# Patient Record
Sex: Male | Born: 1995 | Race: White | Hispanic: Yes | Marital: Single | Smoking: Never smoker
Health system: Southern US, Community
[De-identification: ages and names within clinical notes are randomized; demographics above are authoritative.]

## PROBLEM LIST (undated history)

## (undated) DIAGNOSIS — E079 Disorder of thyroid, unspecified: Secondary | ICD-10-CM

## (undated) DIAGNOSIS — E039 Hypothyroidism, unspecified: Secondary | ICD-10-CM

## (undated) HISTORY — PX: TONSILLECTOMY: SUR1361

---
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
## 2014-01-17 ENCOUNTER — Encounter: Payer: Self-pay | Admitting: Family Medicine

## 2014-01-17 ENCOUNTER — Ambulatory Visit (INDEPENDENT_AMBULATORY_CARE_PROVIDER_SITE_OTHER): Payer: Medicaid Other | Admitting: Family Medicine

## 2014-01-17 VITALS — BP 128/71 | HR 66 | Temp 98.3°F | Ht 71.0 in | Wt 229.2 lb

## 2014-01-17 DIAGNOSIS — M549 Dorsalgia, unspecified: Secondary | ICD-10-CM

## 2014-01-17 MED ORDER — IBUPROFEN 600 MG PO TABS: 600.0000 mg | ORAL_TABLET | Freq: Three times a day (TID) | ORAL | Status: DC | PRN

## 2014-01-17 NOTE — Patient Instructions (Signed)
I think that you have strained your back. Please do the exercises on the handout for the next 2 weeks at least. If the pain gets worse or you develop symptoms in your legs, please come back and see [place]. You can also use ice packs and ibuprofen to help reduce inflammation.

## 2014-01-21 NOTE — Assessment & Plan Note (Addendum)
Muscle strain, no palpable spasm, no red flags - ice, ibuprofen - core strengthening exercises - rtc if not improving in 2 weeks

## 2014-01-21 NOTE — Progress Notes (Signed)
   Subjective:    Patient ID: Robert Black, male    DOB: 11/14/1995, 18 y.o.   MRN: [id number]  Back Pain   Pt presents for low back pain over the past 2 weeks. He does not remember a specific injury but does report lifting weights recently. Pain is bilateral, achy, and non-radiating. Denies fever, headache, saddle anesthesia, bowel or bladder changes.   Review of Systems  Musculoskeletal: Positive for back pain.   See HPI    Objective:   Physical Exam  Nursing note and vitals reviewed. Constitutional: He is oriented to person, place, and time. He appears well-developed and well-nourished. No distress.  HENT:  Head: Normocephalic and atraumatic.  Eyes: Conjunctivae are normal. Right eye exhibits no discharge. Left eye exhibits no discharge. No scleral icterus.  Neck: Normal range of motion. Neck supple.  Cardiovascular: Normal rate.   Pulmonary/Chest: Effort normal.  Musculoskeletal:       Lumbar back: He exhibits tenderness and pain. He exhibits normal range of motion, no bony tenderness, no swelling, no edema, no deformity, no laceration, no spasm and normal pulse.  Normal strength and sensation in bilateral lower extremities  Neurological: He is alert and oriented to person, place, and time.  Skin: Skin is warm and dry. No rash noted. He is not diaphoretic.  Psychiatric: He has a normal mood and affect. His behavior is normal.          Assessment & Plan:

## 2014-03-04 ENCOUNTER — Ambulatory Visit (INDEPENDENT_AMBULATORY_CARE_PROVIDER_SITE_OTHER): Payer: Medicaid Other | Admitting: Family Medicine

## 2014-03-04 ENCOUNTER — Other Ambulatory Visit (HOSPITAL_COMMUNITY)
Admission: RE | Admit: 2014-03-04 | Discharge: 2014-03-04 | Disposition: A | Discharge status: Home / Self Care | Payer: Medicaid Other | Source: Ambulatory Visit | Attending: Family Medicine | Admitting: Family Medicine

## 2014-03-04 ENCOUNTER — Encounter: Payer: Self-pay | Admitting: Family Medicine

## 2014-03-04 VITALS — BP 134/73 | HR 84 | Temp 98.2°F | Resp 18 | Wt 224.0 lb

## 2014-03-04 DIAGNOSIS — Z113 Encounter for screening for infections with a predominantly sexual mode of transmission: Secondary | ICD-10-CM | POA: Diagnosis not present

## 2014-03-04 DIAGNOSIS — Z7251 High risk heterosexual behavior: Secondary | ICD-10-CM

## 2014-03-04 DIAGNOSIS — R300 Dysuria: Secondary | ICD-10-CM

## 2014-03-04 LAB — POCT URINALYSIS DIPSTICK
Bilirubin, UA: NEGATIVE
GLUCOSE UA: NEGATIVE
Ketones, UA: NEGATIVE
LEUKOCYTES UA: NEGATIVE
NITRITE UA: NEGATIVE
Protein, UA: NEGATIVE
RBC UA: NEGATIVE
Spec Grav, UA: 1.025
UROBILINOGEN UA: 0.2
pH, UA: 6

## 2014-03-04 MED ORDER — AZITHROMYCIN 500 MG PO TABS
500.0000 mg | ORAL_TABLET | Freq: Once | ORAL | Status: AC
  Administered 2014-03-04: 500 mg via ORAL

## 2014-03-04 MED ORDER — CEFTRIAXONE SODIUM 250 MG IJ SOLR: 250.0000 mg | Freq: Once | INTRAMUSCULAR | Status: DC

## 2014-03-04 MED ORDER — CEFTRIAXONE SODIUM 250 MG IJ SOLR
1.0000 g | Freq: Once | INTRAMUSCULAR | Status: AC
  Administered 2014-03-04: 1 g via INTRAMUSCULAR

## 2014-03-04 NOTE — Assessment & Plan Note (Signed)
History of gonorrhea.  Unprotectived sex with partner, unsure of STD status. Denies any oral sex taking place. Dysuria present. No signs on exam.  - UA showing no signs of infection.  - urine cytology: chlamydia, gonorrhea, trich pending  - HIV, RPR  - will treat based on history of previous STD exposure and symptoms   - CTX 250 mg x 1   - Azithromycin 1 g x 1

## 2014-03-04 NOTE — Patient Instructions (Signed)
Thank you for coming in,   We will treat you based no your symptoms today. Please let me know if your symptoms persist after the treatment with antibiotics.   I will call you with the results of your lab tests today.    Please feel free to call with any questions or concerns at any time, at [phone]. --Dr. Jordan LikesSchmitz  Dysuria Dysuria is the medical term for pain with urination. There are many causes for dysuria, but urinary tract infection is the most common. If a urinalysis was performed it can show that there is a urinary tract infection. A urine culture confirms that you or your child is sick. You will need to follow up with a healthcare provider because:  If a urine culture was done you will need to know the culture results and treatment recommendations.  If the urine culture was positive, you or your child will need to be put on antibiotics or know if the antibiotics prescribed are the right antibiotics for your urinary tract infection.  If the urine culture is negative (no urinary tract infection), then other causes may need to be explored or antibiotics need to be stopped. Today laboratory work may have been done and there does not seem to be an infection. If cultures were done they will take at least 24 to 48 hours to be completed. Today x-rays may have been taken and they read as normal. No cause can be found for the problems. The x-rays may be re-read by a radiologist and you will be contacted if additional findings are made. You or your child may have been put on medications to help with this problem until you can see your primary caregiver. If the problems get better, see your primary caregiver if the problems return. If you were given antibiotics (medications which kill germs), take all of the mediations as directed for the full course of treatment.  If laboratory work was done, you need to find the results. Leave a telephone number where you can be reached. If this is not possible,  make sure you find out how you are to get test results. HOME CARE INSTRUCTIONS   Drink lots of fluids. For adults, drink eight, 8 ounce glasses of clear juice or water a day. For children, replace fluids as suggested by your caregiver.  Empty the bladder often. Avoid holding urine for long periods of time.  After a bowel movement, women should cleanse front to back, using each tissue only once.  Empty your bladder before and after sexual intercourse.  Take all the medicine given to you until it is gone. You may feel better in a few days, but TAKE ALL MEDICINE.  Avoid caffeine, tea, alcohol and carbonated beverages, because they tend to irritate the bladder.  In men, alcohol may irritate the prostate.  Only take over-the-counter or prescription medicines for pain, discomfort, or fever as directed by your caregiver.  If your caregiver has given you a follow-up appointment, it is very important to keep that appointment. Not keeping the appointment could result in a chronic or permanent injury, pain, and disability. If there is any problem keeping the appointment, you must call back to this facility for assistance. SEEK IMMEDIATE MEDICAL CARE IF:   Back pain develops.  A fever develops.  There is nausea (feeling sick to your stomach) or vomiting (throwing up).  Problems are no better with medications or are getting worse. MAKE SURE YOU:   Understand these instructions.  Will watch your  condition.  Will get help right away if you are not doing well or get worse. Document Released: 01/29/2004 Document Revised: 07/25/2011 Document Reviewed: 12/06/2007 [facility] Patient Information 2015 [employer] [place]. This information is not intended to replace advice given to you by your health care provider. Make sure you discuss any questions you have with your health care provider.

## 2014-03-04 NOTE — Progress Notes (Signed)
   Subjective:    Patient ID: Robert SeatsChristian Black, male    DOB: 07/13/1995, 18 y.o.   MRN: [id number]  HPI  Robert Black is here for in SDA for dysuria.   Symptoms have been ongoing for 2-3 weeks. They have been unchanged. Denies any fever, chillls or night sweats. He reports unprotective sex with someone unsure of their STD exposure. He has a history of high risk sexual behavior with a history of gonorrhea. Denies any discharge, erythema or warmth on or around his genital. He denies any abdominal pain or back pain. He's been having normal bowel movements.    Current Outpatient Prescriptions on File Prior to Visit  Medication Sig Dispense Refill  . ibuprofen (ADVIL,MOTRIN) 600 MG tablet Take 1 tablet (600 mg total) by mouth every 8 (eight) hours as needed for moderate pain.  30 tablet  1  . levothyroxine (SYNTHROID, LEVOTHROID) 25 MCG tablet Take 1 tablet (25 mcg total) by mouth daily.  90 tablet  3   No current facility-administered medications on file prior to visit.    Review of Systems See HPI     Objective:   Physical Exam BP 134/73  Pulse 84  Temp(Src) 98.2 F (36.8 C) (Oral)  Resp 18  Wt 224 lb (101.606 kg)  SpO2 100% General: NAD, well appearing, alert  GU: normal external male genitalia, milking of penis revealed no discharge, no inguinal LAD, no erythema or warmth, no TTP on scrotum or penis         Assessment & Plan:

## 2014-03-05 LAB — URINE CYTOLOGY ANCILLARY ONLY
Chlamydia: POSITIVE — AB
NEISSERIA GONORRHEA: NEGATIVE
Trichomonas: NEGATIVE

## 2014-03-06 ENCOUNTER — Telehealth: Payer: Self-pay | Admitting: *Deleted

## 2014-03-06 NOTE — Telephone Encounter (Signed)
Spoke with patient and gave below message 

## 2014-03-06 NOTE — Telephone Encounter (Signed)
Message copied by Farrell OursEVANS, Hy Swiatek K on Thu Mar 06, 2014  1:44 PM ------      Message from: Clare GandySCHMITZ, JEREMY E      Created: Thu Mar 06, 2014 10:10 AM       Please call patient and inform that he is positive for chlamydia. He was treated for this during his visit. Please tell his to inform his partner so they can be treated as well. Thank you. ------

## 2014-03-06 NOTE — Telephone Encounter (Signed)
Message copied by Farrell OursEVANS, Nakeisha Greenhouse K on Thu Mar 06, 2014  1:45 PM ------      Message from: Clare GandySCHMITZ, JEREMY E      Created: Thu Mar 06, 2014 10:10 AM       Please call patient and inform that he is positive for chlamydia. He was treated for this during his visit. Please tell his to inform his partner so they can be treated as well. Thank you. ------

## 2014-03-07 ENCOUNTER — Other Ambulatory Visit: Payer: Medicaid Other

## 2014-03-07 DIAGNOSIS — Z7251 High risk heterosexual behavior: Secondary | ICD-10-CM

## 2014-03-07 DIAGNOSIS — R300 Dysuria: Secondary | ICD-10-CM

## 2014-03-07 NOTE — Progress Notes (Signed)
HIV AND RPR DONE TODAY Berna Gitto 

## 2014-03-08 LAB — RPR

## 2014-03-08 LAB — HIV ANTIBODY (ROUTINE TESTING W REFLEX): HIV 1&2 Ab, 4th Generation: NONREACTIVE

## 2014-03-10 ENCOUNTER — Telehealth: Payer: Self-pay | Admitting: *Deleted

## 2014-03-10 NOTE — Telephone Encounter (Signed)
Spoke with patient and informed him of below results 

## 2014-03-10 NOTE — Telephone Encounter (Signed)
Message copied by Farrell OursEVANS, Miachel Nardelli K on Mon Mar 10, 2014 12:17 PM ------      Message from: Myra RudeSCHMITZ, JEREMY E      Created: Sat Mar 08, 2014  5:41 PM       Please call patient and inform that his HIV and RPR were non reactive. Thank you. ------

## 2014-06-24 ENCOUNTER — Encounter: Payer: Self-pay | Admitting: Family Medicine

## 2014-06-24 ENCOUNTER — Ambulatory Visit (INDEPENDENT_AMBULATORY_CARE_PROVIDER_SITE_OTHER): Payer: Medicaid Other | Admitting: Family Medicine

## 2014-06-24 VITALS — BP 136/78 | HR 78 | Temp 98.1°F | Ht 71.0 in | Wt 231.9 lb

## 2014-06-24 DIAGNOSIS — T148 Other injury of unspecified body region: Secondary | ICD-10-CM

## 2014-06-24 DIAGNOSIS — W57XXXA Bitten or stung by nonvenomous insect and other nonvenomous arthropods, initial encounter: Secondary | ICD-10-CM

## 2014-06-24 DIAGNOSIS — L03116 Cellulitis of left lower limb: Secondary | ICD-10-CM

## 2014-06-24 MED ORDER — CEPHALEXIN 500 MG PO CAPS: 500.0000 mg | ORAL_CAPSULE | Freq: Three times a day (TID) | ORAL | Status: DC

## 2014-06-24 NOTE — Patient Instructions (Addendum)
Thank you for coming in, today!  I think you're right that these areas are insect bites. They may be a mild infection in the leg bite, especially, but it does not look serious. I want you to take an antibiotic called Keflex (cephalexin), 500 mg three times per day for 5 days. This will help clear up any infection.  I don't think you caught any other viral infections from the bites. If you want, you can take something over the counter like Benadryl, Zyrtec, or Claritin to help with any itching. Keep the areas clean with regular soap and water. DO NOT scratch if at all possible.  If you develop fevers (over 100.4), start throwing up or are unable to keep food down, or if you have worse redness, swelling, or pain around the bites, call or come back to see me. Otherwise come back to see me as you need.  Please feel free to call with any questions or concerns at any time, at [phone]. --Dr. Casper HarrisonStreet

## 2014-06-24 NOTE — Progress Notes (Signed)
   Subjective:    Patient ID: Robert Black, male    DOB: 11/25/1995, 19 y.o.   MRN: [id number]  HPI: Pt presents to clinic, accompanied by mother, with complaint of insect bite in [place] about a week ago (he believe 2/4); interview conducted with mother present per pt permission. He was bitten on the right dorsal wrist and left posterior ankle. He believes the wrist bite was a spider (thinks he saw two "spots" in the middle of the bite) and is unsure what bit him on the ankle. The areas have been itching and slightly red around them, but they are not bleeding. The ankle did have some drainage for a day or so but is not draining now. He has not had fevers, chills, diffuse rash, N/V, change in appetite, or change in bowel / bladder habits. He also denies body aches or coryza-type symptoms; he has had one friend with some flu-like symptoms but has not had any himself. He has not taken any medications other then his long-term Synthroid.  Review of Systems: As above.     Objective:   Physical Exam BP 136/78 mmHg  Pulse 78  Temp(Src) 98.1 F (36.7 C) (Oral)  Ht 5\' 11"  (1.803 m)  Wt 231 lb 14.4 oz (105.189 kg)  BMI 32.36 kg/m2 Gen: well-appearing young adult male HEENT: [place]/AT, EOMI, PERRLA, TM's clear bilaterally  Nasal and posterior oropharyngeal mucosae clear  No tonsillar enlargement Cardio: RRR, no murmur appreciated Pulm: CTAB, no wheezes Abd: soft, nontender, BS+ Ext: right dorsal wrist with red, raised, ~0.5 cm lesion without surrounding induration and minimal erythema  Left posterior lower leg just above ankle with similar lesion, ~0.5 cm in diameter  Leg lesion with slightly more surrounding erythema (~0.5 cm past lesion itself) and mild induration (~2-3 mm)  Both lesions minimally tender with central eschars but no bleeding or drainage     Assessment & Plan:  19yo male with likely insect / possibly spider bites to right wrist and left leg just above ankle - both lesions with  some features suggesting early mild cellulitis, leg lesion moreso - no systemic symptoms to suggest frank sepsis or bug-borne viral disease  Plan: - Rx for Keflex 500 mg TID for 5 days to cover for possible early cellulitis - recommended good local hygiene to both areas, OTC antihistamine for itching, etc, for symptoms management - reviewed red flags that would suggest spreading local or systemic infections and gave specific instructions on when to f/u in clinic or present to the ED - f/u PRN otherwise  Robert Mortonhristopher M Tanelle Lanzo, MD PGY-3, [facility] 06/24/2014, 7:01 PM

## 2014-07-04 ENCOUNTER — Ambulatory Visit (INDEPENDENT_AMBULATORY_CARE_PROVIDER_SITE_OTHER): Payer: Medicaid Other | Admitting: Family Medicine

## 2014-07-04 ENCOUNTER — Encounter: Payer: Self-pay | Admitting: Family Medicine

## 2014-07-04 VITALS — BP 111/73 | HR 73 | Temp 97.8°F | Ht 71.0 in | Wt 234.8 lb

## 2014-07-04 DIAGNOSIS — M546 Pain in thoracic spine: Secondary | ICD-10-CM

## 2014-07-04 DIAGNOSIS — M549 Dorsalgia, unspecified: Secondary | ICD-10-CM

## 2014-07-04 NOTE — Patient Instructions (Signed)
It was a pleasure to see you in the office today. I believe your back pain is muscular in nature.   Ibuprofen 600mg  by mouth every 8 hours with something to eat, for the next 3 to 5 days for the pain.   Heating pad two to three times a day, as well as regular stretching.   Please call immediately if a rash develops over the weekend.  If the pain is not improving by the beginning of next week, please call back to the office.

## 2014-07-04 NOTE — Progress Notes (Signed)
   Subjective:    Patient ID: Robert SeatsChristian Walthall, male    DOB: 09/15/1995, 19 y.o.   MRN: [id number]  HPI Patient here for SDA for left-sided thoracic back pain, started when he woke up yesterday morning and has continued since. May be slightly better this morning. Yesterday morning he took a tablet of ibuprofen 600mg  (9am), however did not feel that it helped him.  Pain has continued, may be radiating slightly to under L axilla.  Has not noticed skin changes.  No fevers or chills, no cough or chest pain. Did go to the gym 2 days ago (running, no weights or lifting).  No falls or trauma.  No manual labor. He is right-hand dominant.     Review of Systems     Objective:   Physical Exam Generally well appearing, no apparent distress HEENT Neck supple. No cervical adenopathy. Able to rotate neck freely, without limitation (active).  Reports some pain in left trapezius and left thoracic paraspinous mm with neck flexion (chin-to-chest).  Full active ROM with both shoulders.  Neer and Hawkins negative. Hand grip full.   Palpable tenderness along left thoracic paraspinous muscles below scapula.  No skin changes over back, abdomen, flank.  Axillary palpation without adenopathy or tenderness.  COR regular S1S2 PULM Clear bilaterally, good air movement noted, no wheezes or rales       Assessment & Plan:

## 2014-07-30 ENCOUNTER — Encounter: Payer: Self-pay | Admitting: Family Medicine

## 2014-07-30 ENCOUNTER — Ambulatory Visit (INDEPENDENT_AMBULATORY_CARE_PROVIDER_SITE_OTHER): Payer: Medicaid Other | Admitting: Family Medicine

## 2014-07-30 VITALS — BP 116/75 | HR 84 | Temp 98.4°F | Ht 71.0 in | Wt 234.6 lb

## 2014-07-30 DIAGNOSIS — A630 Anogenital (venereal) warts: Secondary | ICD-10-CM

## 2014-07-30 MED ORDER — PODOFILOX 0.5 % EX SOLN: CUTANEOUS | Status: DC

## 2014-07-30 NOTE — Patient Instructions (Signed)
  Place dermatology genital warts patient instructions here.

## 2014-07-30 NOTE — Progress Notes (Signed)
  Subjective:    Robert Black is a 19 y.o. male who complains of genital rash . The areas are located on suprapubic/pubic region. They have been present for 1 month. The patient denies pain or cellulitic infection symptoms.  Does endorse unprotected sexual intercourse about 2-3 months ago.  Recent trip to [place] about one month ago, however, area was there at this point.  Denies penile d/c, fever, chills, arthralgias, dysuria, recent changes in soaps, detergents.   The following portions of the patient's history were reviewed and updated as appropriate: allergies, current medications, past family history, past medical history, past social history, past surgical history and problem list.  Review of Systems Pertinent items are noted in HPI.    Objective:    Skin: 4-5 pink,  smooth flattened papules/verroucous lesions on dorsal penile root/genital region. Size range is 0.5 cm.    Assessment:    Warts (Verruca Vulgaris/condyloma acuminata)    Plan:    1. The viral etiology and natural history has been discussed.  2. Various treatment methods, side effects and failure rates have been discussed.   3. Podophyllotoxin BID x 3 days, rest for 4 days, then reapply x 4 if needed  4. The patient will return at 2-4 f/u at that time

## 2014-07-31 NOTE — Progress Notes (Signed)
I was preceptor the day of this visit.   

## 2015-01-23 ENCOUNTER — Ambulatory Visit (INDEPENDENT_AMBULATORY_CARE_PROVIDER_SITE_OTHER): Payer: Medicaid Other | Admitting: Endocrinology

## 2015-01-23 ENCOUNTER — Encounter: Payer: Self-pay | Admitting: Endocrinology

## 2015-01-23 VITALS — BP 124/87 | HR 80 | Temp 97.6°F | Ht 71.0 in | Wt 219.0 lb

## 2015-01-23 DIAGNOSIS — E039 Hypothyroidism, unspecified: Secondary | ICD-10-CM | POA: Diagnosis not present

## 2015-01-23 MED ORDER — LEVOTHYROXINE SODIUM 25 MCG PO TABS: 25.0000 ug | ORAL_TABLET | Freq: Every day | ORAL | Status: DC

## 2015-01-23 NOTE — Patient Instructions (Addendum)
Please make a big push to take the thyroid pill every day.  If you miss a day, take 2 the next day.  i have sent a prescription to your pharmacy, to refill.   Please do the blood test in 1 month.  We'll contact you with results.   With time, your need fort he medication will probably increase to more than 100 mcg/day. Please return in 1 year.        Hypothyroidism The thyroid is a large gland located in the lower front of your neck. The thyroid gland helps control metabolism. Metabolism is how your body handles food. It controls metabolism with the hormone thyroxine. When this gland is underactive (hypothyroid), it produces too little hormone.  CAUSES These include:   Absence or destruction of thyroid tissue.  Goiter due to iodine deficiency.  Goiter due to medications.  Congenital defects (since birth).  Problems with the pituitary. This causes a lack of TSH (thyroid stimulating hormone). This hormone tells the thyroid to turn out more hormone. SYMPTOMS  Lethargy (feeling as though you have no energy)  Cold intolerance  Weight gain (in spite of normal food intake)  Dry skin  Coarse hair  Menstrual irregularity (if severe, may lead to infertility)  Slowing of thought processes Cardiac problems are also caused by insufficient amounts of thyroid hormone. Hypothyroidism in the newborn is cretinism, and is an extreme form. It is important that this form be treated adequately and immediately or it will lead rapidly to retarded physical and mental development. DIAGNOSIS  To prove hypothyroidism, your caregiver may do blood tests and ultrasound tests. Sometimes the signs are hidden. It may be necessary for your caregiver to watch this illness with blood tests either before or after diagnosis and treatment. TREATMENT  Low levels of thyroid hormone are increased by using synthetic thyroid hormone. This is a safe, effective treatment. It usually takes about four weeks to gain the full  effects of the medication. After you have the full effect of the medication, it will generally take another four weeks for problems to leave. Your caregiver may start you on low doses. If you have had heart problems the dose may be gradually increased. It is generally not an emergency to get rapidly to normal. HOME CARE INSTRUCTIONS   Take your medications as your caregiver suggests. Let your caregiver know of any medications you are taking or start taking. Your caregiver will help you with dosage schedules.  As your condition improves, your dosage needs may increase. It will be necessary to have continuing blood tests as suggested by your caregiver.  Report all suspected medication side effects to your caregiver. SEEK MEDICAL CARE IF: Seek medical care if you develop:  Sweating.  Tremulousness (tremors).  Anxiety.  Rapid weight loss.  Heat intolerance.  Emotional swings.  Diarrhea.  Weakness. SEEK IMMEDIATE MEDICAL CARE IF:  You develop chest pain, an irregular heart beat (palpitations), or a rapid heart beat. MAKE SURE YOU:   Understand these instructions.  Will watch your condition.  Will get help right away if you are not doing well or get worse. Document Released: 05/02/2005 Document Revised: 07/25/2011 Document Reviewed: 12/21/2007 [facility] Patient Information 2015 [place], [place]. This information is not intended to replace advice given to you by your health care provider. Make sure you discuss any questions you have with your health care provider.

## 2015-01-23 NOTE — Progress Notes (Signed)
Subjective:    Patient ID: Robert Black, male    DOB: 1995/12/18, 19 y.o.   MRN: [id number]  HPI Pt returns for f/u of chronic primary hypothyroidism (dx'ed 2014; he has been on thyroid hormone therapy since dx; he has never taken non-prescribed thyroid hormone therapy; he has never had thyroid imaging, surgery, or XRT to the neck; he has never been on amiodarone or lithium).  He misses the synthroid approx once-twice per week.  pt states he feels well in general.   Past Medical History  Diagnosis Date  . Asthma     Past Surgical History  Procedure Laterality Date  . Tonsillectomy      Social History   Social History  . Marital Status: Single    Spouse Name: N/A  . Number of Children: N/A  . Years of Education: N/A   Occupational History  . Not on file.   Social History Main Topics  . Smoking status: Never Smoker   . Smokeless tobacco: Never Used  . Alcohol Use: No  . Drug Use: No  . Sexual Activity: Yes   Other Topics Concern  . 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 ibuprofen (ADVIL,MOTRIN) 600 MG tablet Take 1 tablet (600 mg total) by mouth every 8 (eight) hours as needed for moderate pain. 30 tablet 1   No current facility-administered medications on file prior to visit.    No Known Allergies  Family History  Problem Relation Age of Onset  . Thyroid disease Mother     hypothyroid  . Obesity Mother   . Thyroid disease Maternal Grandmother   . Hypertension Maternal Grandmother     BP 124/87 mmHg  Pulse 80  Temp(Src) 97.6 F (36.4 C) (Oral)  Ht  (1.803 m)  Wt 219 lb (99.338 kg)  BMI 30.56 kg/m2  SpO2 96%    Review of Systems He has lost a few lbs.    Objective:   Physical Exam VITAL SIGNS:  See vs page GENERAL: no distress NECK: There is no  palpable thyroid enlargement.  No thyroid nodule is palpable.  No palpable lymphadenopathy at the anterior neck.         Assessment & Plan:  Hypothyroidism, due for recheck medication noncompliance, worse.    Patient is advised the following: Patient Instructions  Please make a big push to take the thyroid pill every day.  If you miss a day, take 2 the next day.  i have sent a prescription to your pharmacy, to refill.   Please do the blood test in 1 month.  We'll contact you with results.   With time, your need fort he medication will probably increase to more than 100 mcg/day. Please return in 1 year.        Hypothyroidism The thyroid is a large gland located in the lower front of your neck. The thyroid gland helps control metabolism. Metabolism is how your body handles food. It controls metabolism with the hormone thyroxine. When this gland is underactive (hypothyroid), it produces too little hormone.  CAUSES These include:   Absence or destruction of thyroid tissue.  Goiter due to iodine deficiency.  Goiter due to medications.  Congenital defects (since birth).  Problems with the pituitary. This causes a lack of TSH (thyroid  stimulating hormone). This hormone tells the thyroid to turn out more hormone. SYMPTOMS  Lethargy (feeling as though you have no energy)  Cold intolerance  Weight gain (in spite of normal food intake)  Dry skin  Coarse hair  Menstrual irregularity (if severe, may lead to infertility)  Slowing of thought processes Cardiac problems are also caused by insufficient amounts of thyroid hormone. Hypothyroidism in the newborn is cretinism, and is an extreme form. It is important that this form be treated adequately and immediately or it will lead rapidly to retarded physical and mental development. DIAGNOSIS  To prove hypothyroidism, your caregiver may do blood tests and ultrasound tests. Sometimes the signs are hidden. It may be necessary for your  caregiver to watch this illness with blood tests either before or after diagnosis and treatment. TREATMENT  Low levels of thyroid hormone are increased by using synthetic thyroid hormone. This is a safe, effective treatment. It usually takes about four weeks to gain the full effects of the medication. After you have the full effect of the medication, it will generally take another four weeks for problems to leave. Your caregiver may start you on low doses. If you have had heart problems the dose may be gradually increased. It is generally not an emergency to get rapidly to normal. HOME CARE INSTRUCTIONS   Take your medications as your caregiver suggests. Let your caregiver know of any medications you are taking or start taking. Your caregiver will help you with dosage schedules.  As your condition improves, your dosage needs may increase. It will be necessary to have continuing blood tests as suggested by your caregiver.  Report all suspected medication side effects to your caregiver. SEEK MEDICAL CARE IF: Seek medical care if you develop:  Sweating.  Tremulousness (tremors).  Anxiety.  Rapid weight loss.  Heat intolerance.  Emotional swings.  Diarrhea.  Weakness. SEEK IMMEDIATE MEDICAL CARE IF:  You develop chest pain, an irregular heart beat (palpitations), or a rapid heart beat. MAKE SURE YOU:   Understand these instructions.  Will watch your condition.  Will get help right away if you are not doing well or get worse. Document Released: 05/02/2005 Document Revised: 07/25/2011 Document Reviewed: 12/21/2007 [facility] Patient Information 2015 [place], [place]. This information is not intended to replace advice given to you by your health care provider. Make sure you discuss any questions you have with your health care provider.

## 2015-02-23 ENCOUNTER — Other Ambulatory Visit (INDEPENDENT_AMBULATORY_CARE_PROVIDER_SITE_OTHER): Payer: Medicaid Other

## 2015-02-23 DIAGNOSIS — E039 Hypothyroidism, unspecified: Secondary | ICD-10-CM

## 2015-02-23 LAB — TSH: TSH: 3.45 u[IU]/mL (ref 0.35–5.50)

## 2015-02-25 ENCOUNTER — Ambulatory Visit (INDEPENDENT_AMBULATORY_CARE_PROVIDER_SITE_OTHER): Payer: Medicaid Other | Admitting: Family Medicine

## 2015-02-25 ENCOUNTER — Encounter: Payer: Self-pay | Admitting: Family Medicine

## 2015-02-25 VITALS — BP 120/74 | HR 76 | Temp 98.0°F | Ht 71.0 in | Wt 220.0 lb

## 2015-02-25 DIAGNOSIS — F1990 Other psychoactive substance use, unspecified, uncomplicated: Secondary | ICD-10-CM

## 2015-02-25 DIAGNOSIS — E039 Hypothyroidism, unspecified: Secondary | ICD-10-CM

## 2015-02-25 NOTE — Progress Notes (Signed)
Patient ID: Robert Black, male   DOB: 09/11/1995, 19 y.o.   MRN: [id number]    Subjective: WU:[id number]:concerns for elevated BP HPI: Patient is a 19 y.o. male with a past medical history of h/o hypothyroidism presenting to clinic today for concerns of elevated BPs.  The patient states he went to [place] urgent care in [place] approximately 3 [place]. The patient states he went to the urgent care because he was acting funny after drinking alcohol and doing drugs. He thought he was snorting cocaine however the providers at the urgent care told him it was crystal meth.  He cannot recall what his blood pressure was at that time. He did note a headache.  He was prescribed HCTZ (unknown dosage) and losartan 100mg . He stopped taking these 3-4 days ago.     The patient also has a h/o hypothyroidism. No heat/cold intolerance, no change in skin/hair, or palpitations. He has been on synthroid for over a year. He saw his endocrinologist last month and got a repeat TSH that was normal  Social History: Has intermittently smoked cigarettes in the past  ROS: All other systems reviewed and are negative.  Past Medical History Patient Active Problem List   Diagnosis Date Noted  . Illicit drug use 02/25/2015  . Condyloma acuminata 07/30/2014  . Acute thoracic back pain 07/04/2014  . High risk sexual behavior 03/04/2014  . Back pain without radiculopathy 01/17/2014  . Obesity (BMI >32) 11/26/2013  . Hypothyroidism 06/03/2013  . Abnormal endocrine laboratory test finding 11/28/2012  . Acanthosis 11/28/2012  . Obesity, unspecified 11/28/2012    Medications- reviewed and updated Current Outpatient Prescriptions  Medication Sig Dispense Refill  . ibuprofen (ADVIL,MOTRIN) 600 MG tablet Take 1 tablet (600 mg total) by mouth every 8 (eight) hours as needed for moderate pain. 30 tablet 1  . levothyroxine (SYNTHROID, LEVOTHROID) 25 MCG tablet Take 1 tablet (25 mcg total) by mouth daily. 90 tablet 0   No current  facility-administered medications for this visit.    Objective: Office vital signs reviewed. BP 120/74 mmHg  Pulse 76  Temp(Src) 98 F (36.7 C) (Oral)  Ht 5\' 11"  (1.803 m)  Wt 220 lb (99.791 kg)  BMI 30.70 kg/m2   Physical Examination:  General: Awake, alert, well- nourished, NAD Neck: No thyromegaly Cardio: RRR, no m/r/g noted.  Pulm: No increased WOB.  CTAB, without wheezes, rhonchi or crackles noted.  Skin: dry, intact, no rashes or lesions  Assessment/Plan: Illicit drug use Patient with an unknown elevated BP in the setting of alcohol and crystal meth intoxication per report. His BPs have always been within the normal range in our system. His father has a h/o HTN and the patient is obese with a BMI >30. The patient stopped losartan and HCTZ approximately 3-4 days. These can have a delayed effect up to 5 days.  - follow up in 1 month after not taking anti-hypertensives to evaluate BP - Stressed importance/consequences of illicit drug use, patient voiced understanding  Hypothyroidism Per endo note, pt was not regularly taking medication. Urged pt to take Synthroid regularly and his most recent TSH after taking it regularly was within normal ranges.     No orders of the defined types were placed in this encounter.    No orders of the defined types were placed in this encounter.    Robert Black PGY-2, [facility]

## 2015-02-25 NOTE — Assessment & Plan Note (Signed)
Patient with an unknown elevated BP in the setting of alcohol and crystal meth intoxication per report. His BPs have always been within the normal range in our system. His father has a h/o HTN and the patient is obese with a BMI >30. The patient stopped losartan and HCTZ approximately 3-4 days. These can have a delayed effect up to 5 days.  - follow up in 1 month after not taking anti-hypertensives to evaluate BP - Stressed importance/consequences of illicit drug use, patient voiced understanding

## 2015-02-25 NOTE — Patient Instructions (Addendum)
STOP taking losartan and hydrochlorothiazide Follow up for a nurses visit in 1 month so we can see how you blood pressure is doing.

## 2015-02-26 ENCOUNTER — Encounter: Payer: Self-pay | Admitting: Family Medicine

## 2015-02-26 NOTE — Assessment & Plan Note (Signed)
Per endo note, pt was not regularly taking medication. Urged pt to take Synthroid regularly and his most recent TSH after taking it regularly was within normal ranges.

## 2015-03-02 ENCOUNTER — Telehealth: Payer: Self-pay

## 2015-03-02 NOTE — Telephone Encounter (Signed)
Pt is aware of results from last week TSH test

## 2015-03-25 ENCOUNTER — Ambulatory Visit (INDEPENDENT_AMBULATORY_CARE_PROVIDER_SITE_OTHER): Payer: Medicaid Other | Admitting: *Deleted

## 2015-03-25 VITALS — BP 126/82 | HR 75

## 2015-03-25 DIAGNOSIS — Z0130 Encounter for examination of blood pressure without abnormal findings: Secondary | ICD-10-CM

## 2015-03-25 DIAGNOSIS — Z136 Encounter for screening for cardiovascular disorders: Secondary | ICD-10-CM

## 2015-03-25 NOTE — Progress Notes (Signed)
   Patient in nurse clinic for blood pressure check.  Blood pressure 126/82 left arm manually, heart rate 75.  Patient denies any pain today.  Will forward to PCP.  Clovis PuMartin, Tamika L, RN

## 2015-04-07 ENCOUNTER — Encounter: Payer: Self-pay | Admitting: Family Medicine

## 2015-04-07 ENCOUNTER — Ambulatory Visit (INDEPENDENT_AMBULATORY_CARE_PROVIDER_SITE_OTHER): Payer: Medicaid Other | Admitting: Family Medicine

## 2015-04-07 VITALS — BP 120/78 | HR 88 | Temp 97.5°F | Wt 225.0 lb

## 2015-04-07 DIAGNOSIS — J020 Streptococcal pharyngitis: Secondary | ICD-10-CM | POA: Diagnosis present

## 2015-04-07 DIAGNOSIS — J029 Acute pharyngitis, unspecified: Secondary | ICD-10-CM | POA: Insufficient documentation

## 2015-04-07 LAB — POCT RAPID STREP A (OFFICE): Rapid Strep A Screen: NEGATIVE

## 2015-04-07 LAB — POCT MONO (EPSTEIN BARR VIRUS): Mono, POC: NEGATIVE

## 2015-04-07 NOTE — Assessment & Plan Note (Signed)
Most likely viral in nature.  Some concern for STI with past medical history and possible exposure. Has taken amoxicillin and developed no rash so less likely for mononucleosis - Rapid strep, Monospot, and gonorrhea and chlamydia throat culture - Informed that treatment will be dependent upon results. If all results negative the most likely viral in nature and supportive care.

## 2015-04-07 NOTE — Progress Notes (Signed)
   Subjective:    Patient ID: Robert SeatsChristian Wasser, male    DOB: 04/30/1996, 19 y.o.   MRN: [id number]  Seen for Same day visit for   CC: SORE THROAT  Sore throat began 14 days ago. He was sore throat and took amoxicillin and had improvement. He took it for three days.  Pain is: most in the morning  Severity: no pain now  Medications tried: amoxicillin  Strep throat exposure: unsure  STD exposure: has performed oral sex. Sexually active with females.   Symptoms Fever: no Cough: no Runny nose: yes Muscle aches: no Swollen Glands: no Trouble breathing: no Drooling: no Weight loss: no   Review of Systems   See HPI for ROS. Objective:  BP 120/78 mmHg  Pulse 88  Temp(Src) 97.5 F (36.4 C) (Oral)  Wt 225 lb (102.059 kg)  General: NAD, well-appearing HEENT: Moist mucous membranes, tympanic membrane's clear and intact bilaterally, clear conjunctiva, no tonsillar exudates, mild cervical adenopathy, Cardiac: RRR, normal heart sounds, no murmurs.  Respiratory: CTAB, normal effort Abdomen: soft, nontender, nondistended, no hepatic or splenomegaly. Bowel sounds present Extremities: WWP. Skin: warm and dry, no rashes noted Neuro: alert and oriented, no focal deficits     Assessment & Plan:   Sore throat Most likely viral in nature.  Some concern for STI with past medical history and possible exposure. Has taken amoxicillin and developed no rash so less likely for mononucleosis - Rapid strep, Monospot, and gonorrhea and chlamydia throat culture - Informed that treatment will be dependent upon results. If all results negative the most likely viral in nature and supportive care.

## 2015-04-07 NOTE — Patient Instructions (Signed)
Thank you for coming in,   I will call you with results from today. Based on the results will depend on the treatment plan.  Sign up for My Chart to have easy access to your labs results, and communication with your Primary care physician   Please feel free to call with any questions or concerns at any time, at [phone]. --Dr. Jordan LikesSchmitz Pharyngitis Pharyngitis is redness, pain, and swelling (inflammation) of your pharynx.  CAUSES  Pharyngitis is usually caused by infection. Most of the time, these infections are from viruses (viral) and are part of a cold. However, sometimes pharyngitis is caused by bacteria (bacterial). Pharyngitis can also be caused by allergies. Viral pharyngitis may be spread from person to person by coughing, sneezing, and personal items or utensils (cups, forks, spoons, toothbrushes). Bacterial pharyngitis may be spread from person to person by more intimate contact, such as kissing.  SIGNS AND SYMPTOMS  Symptoms of pharyngitis include:   Sore throat.   Tiredness (fatigue).   Low-grade fever.   Headache.  Joint pain and muscle aches.  Skin rashes.  Swollen lymph nodes.  Plaque-like film on throat or tonsils (often seen with bacterial pharyngitis). DIAGNOSIS  Your health care provider will ask you questions about your illness and your symptoms. Your medical history, along with a physical exam, is often all that is needed to diagnose pharyngitis. Sometimes, a rapid strep test is done. Other lab tests may also be done, depending on the suspected cause.  TREATMENT  Viral pharyngitis will usually get better in 3-4 days without the use of medicine. Bacterial pharyngitis is treated with medicines that kill germs (antibiotics).  HOME CARE INSTRUCTIONS   Drink enough water and fluids to keep your urine clear or pale yellow.   Only take over-the-counter or prescription medicines as directed by your health care provider:   If you are prescribed antibiotics,  make sure you finish them even if you start to feel better.   Do not take aspirin.   Get lots of rest.   Gargle with 8 oz of salt water ( tsp of salt per 1 qt of water) as often as every 1-2 hours to soothe your throat.   Throat lozenges (if you are not at risk for choking) or sprays may be used to soothe your throat. SEEK MEDICAL CARE IF:   You have large, tender lumps in your neck.  You have a rash.  You cough up green, yellow-brown, or bloody spit. SEEK IMMEDIATE MEDICAL CARE IF:   Your neck becomes stiff.  You drool or are unable to swallow liquids.  You vomit or are unable to keep medicines or liquids down.  You have severe pain that does not go away with the use of recommended medicines.  You have trouble breathing (not caused by a stuffy nose). MAKE SURE YOU:   Understand these instructions.  Will watch your condition.  Will get help right away if you are not doing well or get worse.   This information is not intended to replace advice given to you by your health care provider. Make sure you discuss any questions you have with your health care provider.   Document Released: 05/02/2005 Document Revised: 02/20/2013 Document Reviewed: 01/07/2013 Elsevier Interactive Patient Education [employer]2016 [employer]

## 2015-05-13 ENCOUNTER — Other Ambulatory Visit: Payer: Self-pay | Admitting: Endocrinology

## 2015-05-14 ENCOUNTER — Telehealth: Payer: Self-pay | Admitting: Endocrinology

## 2015-05-14 ENCOUNTER — Other Ambulatory Visit: Payer: Self-pay | Admitting: *Deleted

## 2015-05-14 MED ORDER — LEVOTHYROXINE SODIUM 25 MCG PO TABS: 25.0000 ug | ORAL_TABLET | Freq: Every day | ORAL | Status: DC

## 2015-05-14 NOTE — Telephone Encounter (Signed)
Patient mother called requesting a refill   Rx: Levothyroxine   Pharmacy: [facility]   Thank you

## 2015-05-14 NOTE — Telephone Encounter (Signed)
rx sent

## 2015-05-16 ENCOUNTER — Encounter (HOSPITAL_COMMUNITY): Payer: Self-pay | Admitting: *Deleted

## 2015-05-16 ENCOUNTER — Emergency Department (HOSPITAL_COMMUNITY): Payer: Medicaid Other

## 2015-05-16 ENCOUNTER — Emergency Department (HOSPITAL_COMMUNITY)
Admission: EM | Admit: 2015-05-16 | Discharge: 2015-05-16 | Disposition: A | Discharge status: Home / Self Care | Payer: Medicaid Other | Attending: Emergency Medicine | Admitting: Emergency Medicine

## 2015-05-16 DIAGNOSIS — Y9289 Other specified places as the place of occurrence of the external cause: Secondary | ICD-10-CM | POA: Diagnosis not present

## 2015-05-16 DIAGNOSIS — E079 Disorder of thyroid, unspecified: Secondary | ICD-10-CM | POA: Diagnosis not present

## 2015-05-16 DIAGNOSIS — W231XXA Caught, crushed, jammed, or pinched between stationary objects, initial encounter: Secondary | ICD-10-CM | POA: Insufficient documentation

## 2015-05-16 DIAGNOSIS — S92535A Nondisplaced fracture of distal phalanx of left lesser toe(s), initial encounter for closed fracture: Secondary | ICD-10-CM | POA: Insufficient documentation

## 2015-05-16 DIAGNOSIS — Y998 Other external cause status: Secondary | ICD-10-CM | POA: Insufficient documentation

## 2015-05-16 DIAGNOSIS — J45909 Unspecified asthma, uncomplicated: Secondary | ICD-10-CM | POA: Insufficient documentation

## 2015-05-16 DIAGNOSIS — Z79899 Other long term (current) drug therapy: Secondary | ICD-10-CM | POA: Insufficient documentation

## 2015-05-16 DIAGNOSIS — S92515A Nondisplaced fracture of proximal phalanx of left lesser toe(s), initial encounter for closed fracture: Secondary | ICD-10-CM | POA: Diagnosis not present

## 2015-05-16 DIAGNOSIS — Y9389 Activity, other specified: Secondary | ICD-10-CM | POA: Insufficient documentation

## 2015-05-16 DIAGNOSIS — S99922A Unspecified injury of left foot, initial encounter: Secondary | ICD-10-CM | POA: Diagnosis present

## 2015-05-16 DIAGNOSIS — S92912A Unspecified fracture of left toe(s), initial encounter for closed fracture: Secondary | ICD-10-CM

## 2015-05-16 HISTORY — DX: Disorder of thyroid, unspecified: E07.9

## 2015-05-16 MED ORDER — HYDROCODONE-ACETAMINOPHEN 5-325 MG PO TABS: 1.0000 | ORAL_TABLET | Freq: Four times a day (QID) | ORAL | Status: DC | PRN

## 2015-05-16 NOTE — ED Notes (Signed)
Patient transported to X-ray 

## 2015-05-16 NOTE — ED Notes (Signed)
Left little toe injury last night. Area bruised

## 2015-05-16 NOTE — ED Provider Notes (Signed)
CSN: [id number]     Arrival date & time 05/16/15  [id number] History   By signing my name below, I, Lyndel SafeKaitlyn Shelton, attest that this documentation has been prepared under the direction and in the presence of Kerrie BuffaloHope Neese, NP.  Electronically Signed: Lyndel SafeKaitlyn Shelton, ED Scribe. 05/16/2015. 7:20 PM.   Chief Complaint  Patient presents with  . Toe Injury   Patient is a 19 y.o. male presenting with toe pain. The history is provided by the patient. No language interpreter was used.  Toe Pain This is a new problem. The current episode started yesterday. The problem occurs constantly. The problem has not changed since onset.The symptoms are aggravated by walking. Nothing relieves the symptoms. He has tried a cold compress for the symptoms. The treatment provided no relief.   HPI Comments: Robert Black is a 19 y.o. male who presents to the Emergency Department complaining of sudden onset, constant, mild left little toe pain s/p blunt injury that occurred last night. Pt reports he jammed his left, 5th toe into the corner of a wall last night. There is associated ecchymosis to left little toe. He applied ice to the affected area without significant relief, but has not taken any alleviating medication. His pain is exacerbated with ambulation. He has no other complaints. Denies numbness or tingling.   Past Medical History  Diagnosis Date  . Asthma   . Thyroid disease    Past Surgical History  Procedure Laterality Date  . Tonsillectomy     Family History  Problem Relation Age of Onset  . Thyroid disease Mother     hypothyroid  . Obesity Mother   . Thyroid disease Maternal Grandmother   . Hypertension Maternal Grandmother    Social History  Substance Use Topics  . Smoking status: Never Smoker   . Smokeless tobacco: Never Used  . Alcohol Use: Yes    Review of Systems  Musculoskeletal: Positive for arthralgias ( left little toe).  Skin: Positive for color change ( ecchymosis  left little toe).   All other systems reviewed and are negative.  Allergies  Review of patient's allergies indicates no known allergies.  Home Medications   Prior to Admission medications   Medication Sig Start Date End Date Taking? Authorizing Provider  HYDROcodone-acetaminophen (NORCO) 5-325 MG tablet Take 1 tablet by mouth every 6 (six) hours as needed. 05/16/15   Hope Orlene OchM Neese, NP  ibuprofen (ADVIL,MOTRIN) 600 MG tablet Take 1 tablet (600 mg total) by mouth every 8 (eight) hours as needed for moderate pain. 01/17/14   Abram SanderElena M Adamo, MD  levothyroxine (SYNTHROID, LEVOTHROID) 25 MCG tablet Take 1 tablet (25 mcg total) by mouth daily. 05/14/15   Reather LittlerAjay Kumar, MD   BP 128/75 mmHg  Pulse 91  Temp(Src) 98.1 F (36.7 C) (Oral)  Resp 18  Ht 6' (1.829 m)  Wt 233 lb 3.2 oz (105.779 kg)  BMI 31.62 kg/m2  SpO2 98% Physical Exam  Constitutional: He is oriented to person, place, and time. He appears well-developed and well-nourished.  HENT:  Head: Normocephalic.  Eyes: EOM are normal.  Neck: Neck supple.  Cardiovascular: Normal rate and intact distal pulses.   Pulmonary/Chest: Effort normal.  Abdominal: Soft. There is no tenderness.  Musculoskeletal: Normal range of motion. He exhibits tenderness.  Left foot; swelling and ecchymosis noted to left little toe; pedal pulses 2+; adequate circulation;Tenderness noted on exam.   Neurological: He is alert and oriented to person, place, and time. No cranial nerve deficit.  Skin: Skin  is warm and dry.  Psychiatric: He has a normal mood and affect. His behavior is normal.  Nursing note and vitals reviewed.   ED Course  Procedures  X-rays reviewed with Dr. Rhunette Croft  DIAGNOSTIC STUDIES: Oxygen Saturation is 98% on RA, normal by my interpretation.    COORDINATION OF CARE: 7:19 PM Discussed treatment plan which includes to order plain films of left little toe with pt. Pt acknowledges and agrees to plan.   Imaging Review Dg Toe 5th Left  05/16/2015  CLINICAL  DATA:  19 year old male with left fifth toe injury after jamming it into a wall earlier today. EXAM: DG TOE 5TH LEFT COMPARISON:  01/31/2006. FINDINGS: There is an acute nondisplaced transverse fracture through the proximal metaphysis of the fifth proximal phalanx. This does not appear to extend to the articular surface at the fifth MTP joint. In addition, on the lateral projection, there is a subtle nondisplaced fracture through the dorsal aspect of the proximal fifth distal phalanx, which does appear to extend to the articular surface. Soft tissues are diffusely swollen around the fifth digit. IMPRESSION: 1. Acute nondisplaced transverse fracture through the proximal metaphysis of the fifth proximal phalanx. 2. Acute nondisplaced oblique fracture through the dorsal aspect of the proximal fifth distal phalanx which appears to be intra-articular. Electronically Signed   By: Trudie Reed M.D.   On: 05/16/2015 19:43   I have personally reviewed and evaluated these images results as part of my medical decision-making.   MDM   Final diagnoses:  Fracture, toe, left, closed, initial encounter    Patient X-Ray positive for acute non displaced fracture of the 5th proximal phalanx and nondisplaced fracture through the dorsal aspect of the proximal 5th distal phalanx. Left, 5th toe buddy tapped. Pt advised to follow up with orthopedics. Patient given post op shoe while in ED, conservative therapy recommended and discussed. Pain management. Patient will be discharged home & is agreeable with above plan. Returns precautions discussed. Pt appears safe for discharge.  I personally performed the services described in this documentation, which was scribed in my presence. The recorded information has been reviewed and is accurate.    [address] [place], [place] 05/16/15 2027  Derwood Kaplan, MD 05/17/15 [id number]

## 2015-06-17 ENCOUNTER — Other Ambulatory Visit: Payer: Self-pay | Admitting: Orthopedic Surgery

## 2015-06-17 ENCOUNTER — Encounter (HOSPITAL_BASED_OUTPATIENT_CLINIC_OR_DEPARTMENT_OTHER): Payer: Self-pay | Admitting: *Deleted

## 2015-06-18 NOTE — H&P (Addendum)
Robert Black is an 20 y.o. male.   CC / Reason for Visit: Left wrist problem HPI: This patient is a 20 year old, right-hand-dominant, male who indicates that he was running in the park and fell on outstretched hand.  He states that his wrist continued to hurt and he was seen by Dr. Berline Chough with [facility] who x-rayed him, found him to have a scaphoid fracture, and placed him into a thumb spica removable wrist splint approximately a week ago.  He is here today with his mother to discuss treatment options including surgery.  Past Medical History  Diagnosis Date  . Thyroid disease   . Asthma     as a child  . Hypothyroidism     Past Surgical History  Procedure Laterality Date  . Tonsillectomy      Family History  Problem Relation Age of Onset  . Thyroid disease Mother     hypothyroid  . Obesity Mother   . Thyroid disease Maternal Grandmother   . Hypertension Maternal Grandmother    Social History:  reports that he has never smoked. He has never used smokeless tobacco. He reports that he drinks alcohol. He reports that he does not use illicit drugs.  Allergies: No Known Allergies  No prescriptions prior to admission    No results found for this or any previous visit (from the past 48 hour(s)). No results found.  Review of Systems  All other systems reviewed and are negative.   Height 6' (1.829 m), weight 105.688 kg (233 lb). Physical Exam   Constitutional:  WD, WN, NAD HEENT:  NCAT, EOMI Neuro/Psych:  Alert & oriented to person, place, and time; appropriate mood & affect Lymphatic: No generalized UE edema or lymphadenopathy Extremities / MSK:  Both UE are normal with respect to appearance, ranges of motion, joint stability, muscle strength/tone, sensation, & perfusion except as otherwise noted:  There is no swelling, discoloration, deformity about the left wrist.  There is pain with palpation in the anatomical snuffbox.  The patient does have full digital  range of motion.  NVI.  Labs / Xrays:  No radiographic studies obtained today.  X-rays from 06/11/2015 of the left wrist demonstrates a closed, extra-articular, transverse, displaced fracture at the waist of the scaphoid.  Assessment: Left scaphoid fracture, slightly displaced, nearing 21 months of age with only recent immobilization  Plan:  The findings were discussed with the patient as well as his mother.  The patient will be scheduled for surgical repair. The details of the operative procedure were discussed with the patient.  Questions were invited and answered.  In addition to the goal of the procedure, the risks of the procedure to include but not limited to bleeding; infection; damage to the nerves or blood vessels that could result in bleeding, numbness, weakness, chronic pain, and the need for additional procedures; stiffness; the need for revision surgery; and anesthetic risks were reviewed.  No specific outcome was guaranteed or implied.  Informed consent was obtained.   Andie Mortimer A., MD 06/18/2015, 1:56 PM

## 2015-06-22 ENCOUNTER — Ambulatory Visit (HOSPITAL_BASED_OUTPATIENT_CLINIC_OR_DEPARTMENT_OTHER): Payer: Medicaid Other | Admitting: Anesthesiology

## 2015-06-22 ENCOUNTER — Encounter (HOSPITAL_BASED_OUTPATIENT_CLINIC_OR_DEPARTMENT_OTHER)
Admission: RE | Disposition: A | Discharge status: Home / Self Care | Payer: Self-pay | Source: Ambulatory Visit | Attending: Orthopedic Surgery

## 2015-06-22 ENCOUNTER — Encounter (HOSPITAL_BASED_OUTPATIENT_CLINIC_OR_DEPARTMENT_OTHER): Payer: Self-pay | Admitting: Certified Registered"

## 2015-06-22 ENCOUNTER — Ambulatory Visit (HOSPITAL_COMMUNITY): Payer: Medicaid Other

## 2015-06-22 ENCOUNTER — Ambulatory Visit (HOSPITAL_BASED_OUTPATIENT_CLINIC_OR_DEPARTMENT_OTHER)
Admission: RE | Admit: 2015-06-22 | Discharge: 2015-06-22 | Disposition: A | Discharge status: Home / Self Care | Payer: Medicaid Other | Source: Ambulatory Visit | Attending: Orthopedic Surgery | Admitting: Orthopedic Surgery

## 2015-06-22 DIAGNOSIS — S62022A Displaced fracture of middle third of navicular [scaphoid] bone of left wrist, initial encounter for closed fracture: Secondary | ICD-10-CM | POA: Diagnosis present

## 2015-06-22 DIAGNOSIS — Y9302 Activity, running: Secondary | ICD-10-CM | POA: Diagnosis not present

## 2015-06-22 DIAGNOSIS — Z6831 Body mass index (BMI) 31.0-31.9, adult: Secondary | ICD-10-CM | POA: Diagnosis not present

## 2015-06-22 DIAGNOSIS — W1830XA Fall on same level, unspecified, initial encounter: Secondary | ICD-10-CM | POA: Insufficient documentation

## 2015-06-22 DIAGNOSIS — Y92830 Public park as the place of occurrence of the external cause: Secondary | ICD-10-CM | POA: Diagnosis not present

## 2015-06-22 DIAGNOSIS — E669 Obesity, unspecified: Secondary | ICD-10-CM | POA: Diagnosis not present

## 2015-06-22 DIAGNOSIS — E039 Hypothyroidism, unspecified: Secondary | ICD-10-CM | POA: Diagnosis not present

## 2015-06-22 DIAGNOSIS — Z4789 Encounter for other orthopedic aftercare: Secondary | ICD-10-CM

## 2015-06-22 HISTORY — PX: ORIF SCAPHOID FRACTURE: SHX2130

## 2015-06-22 HISTORY — DX: Hypothyroidism, unspecified: E03.9

## 2015-06-22 SURGERY — OPEN REDUCTION INTERNAL FIXATION (ORIF) SCAPHOID FRACTURE
Anesthesia: General | Site: Wrist | Laterality: Left

## 2015-06-22 MED ORDER — PROPOFOL 10 MG/ML IV BOLUS
INTRAVENOUS | Status: DC | PRN
  Administered 2015-06-22: 200 mg via INTRAVENOUS

## 2015-06-22 MED ORDER — OXYCODONE-ACETAMINOPHEN 5-325 MG PO TABS: 1.0000 | ORAL_TABLET | Freq: Four times a day (QID) | ORAL | Status: DC | PRN

## 2015-06-22 MED ORDER — LIDOCAINE HCL (CARDIAC) 20 MG/ML IV SOLN
INTRAVENOUS | Status: AC
  Filled 2015-06-22: qty 5

## 2015-06-22 MED ORDER — LIDOCAINE HCL (CARDIAC) 20 MG/ML IV SOLN
INTRAVENOUS | Status: DC | PRN
  Administered 2015-06-22: 20 mg via INTRAVENOUS

## 2015-06-22 MED ORDER — FENTANYL CITRATE (PF) 100 MCG/2ML IJ SOLN
INTRAMUSCULAR | Status: AC
  Filled 2015-06-22: qty 2

## 2015-06-22 MED ORDER — PROPOFOL 500 MG/50ML IV EMUL
INTRAVENOUS | Status: AC
  Filled 2015-06-22: qty 50

## 2015-06-22 MED ORDER — CEFAZOLIN SODIUM-DEXTROSE 2-3 GM-% IV SOLR
2.0000 g | INTRAVENOUS | Status: AC
  Administered 2015-06-22: 2 g via INTRAVENOUS

## 2015-06-22 MED ORDER — LIDOCAINE HCL (CARDIAC) 20 MG/ML IV SOLN
INTRAVENOUS | Status: AC
Start: 2015-06-22 — End: 2015-06-22
  Filled 2015-06-22: qty 5

## 2015-06-22 MED ORDER — CEFAZOLIN SODIUM-DEXTROSE 2-3 GM-% IV SOLR
INTRAVENOUS | Status: AC
  Filled 2015-06-22: qty 50

## 2015-06-22 MED ORDER — MIDAZOLAM HCL 2 MG/2ML IJ SOLN
INTRAMUSCULAR | Status: AC
  Filled 2015-06-22: qty 2

## 2015-06-22 MED ORDER — MEPERIDINE HCL 25 MG/ML IJ SOLN: 6.2500 mg | INTRAMUSCULAR | Status: DC | PRN

## 2015-06-22 MED ORDER — FENTANYL CITRATE (PF) 100 MCG/2ML IJ SOLN
50.0000 ug | INTRAMUSCULAR | Status: AC | PRN
  Administered 2015-06-22: 50 ug via INTRAVENOUS
  Administered 2015-06-22: 100 ug via INTRAVENOUS
  Administered 2015-06-22: 50 ug via INTRAVENOUS

## 2015-06-22 MED ORDER — BUPIVACAINE-EPINEPHRINE (PF) 0.5% -1:200000 IJ SOLN
INTRAMUSCULAR | Status: DC | PRN
  Administered 2015-06-22: 30 mL via PERINEURAL

## 2015-06-22 MED ORDER — SCOPOLAMINE 1 MG/3DAYS TD PT72: 1.0000 | MEDICATED_PATCH | Freq: Once | TRANSDERMAL | Status: DC | PRN

## 2015-06-22 MED ORDER — HYDROMORPHONE HCL 1 MG/ML IJ SOLN: 0.2500 mg | INTRAMUSCULAR | Status: DC | PRN

## 2015-06-22 MED ORDER — MIDAZOLAM HCL 2 MG/2ML IJ SOLN
1.0000 mg | INTRAMUSCULAR | Status: DC | PRN
  Administered 2015-06-22 (×2): 2 mg via INTRAVENOUS

## 2015-06-22 MED ORDER — GLYCOPYRROLATE 0.2 MG/ML IJ SOLN: 0.2000 mg | Freq: Once | INTRAMUSCULAR | Status: DC | PRN

## 2015-06-22 MED ORDER — OXYCODONE HCL 5 MG PO TABS: 5.0000 mg | ORAL_TABLET | Freq: Once | ORAL | Status: DC | PRN

## 2015-06-22 MED ORDER — BUPIVACAINE-EPINEPHRINE (PF) 0.5% -1:200000 IJ SOLN
INTRAMUSCULAR | Status: AC
  Filled 2015-06-22: qty 30

## 2015-06-22 MED ORDER — LACTATED RINGERS IV SOLN
INTRAVENOUS | Status: DC
Start: 2015-06-22 — End: 2015-06-22

## 2015-06-22 MED ORDER — OXYCODONE HCL 5 MG/5ML PO SOLN: 5.0000 mg | Freq: Once | ORAL | Status: DC | PRN

## 2015-06-22 MED ORDER — DEXAMETHASONE SODIUM PHOSPHATE 10 MG/ML IJ SOLN
INTRAMUSCULAR | Status: AC
  Filled 2015-06-22: qty 1

## 2015-06-22 MED ORDER — ONDANSETRON HCL 4 MG/2ML IJ SOLN
INTRAMUSCULAR | Status: AC
  Filled 2015-06-22: qty 2

## 2015-06-22 MED ORDER — LACTATED RINGERS IV SOLN
INTRAVENOUS | Status: DC
  Administered 2015-06-22: 08:00:00 via INTRAVENOUS
  Administered 2015-06-22: 10 mL/h via INTRAVENOUS

## 2015-06-22 SURGICAL SUPPLY — 57 items
BANDAGE COBAN STERILE 2 (GAUZE/BANDAGES/DRESSINGS) IMPLANT
BIT DRILL CANN 2.4 (BIT) ×2
BIT DRILL CANN 2.4MM (BIT) ×1
BIT DRILL CANN MAX VPC 2.4 (BIT) ×1 IMPLANT
BLADE MINI RND TIP GREEN BEAV (BLADE) IMPLANT
BLADE SURG 15 STRL LF DISP TIS (BLADE) ×1 IMPLANT
BLADE SURG 15 STRL SS (BLADE) ×2
BNDG COHESIVE 4X5 TAN STRL (GAUZE/BANDAGES/DRESSINGS) ×3 IMPLANT
BNDG ESMARK 4X9 LF (GAUZE/BANDAGES/DRESSINGS) ×3 IMPLANT
BNDG GAUZE ELAST 4 BULKY (GAUZE/BANDAGES/DRESSINGS) ×6 IMPLANT
BRUSH SCRUB EZ PLAIN DRY (MISCELLANEOUS) IMPLANT
CANISTER SUCT 1200ML W/VALVE (MISCELLANEOUS) ×3 IMPLANT
CHLORAPREP W/TINT 26ML (MISCELLANEOUS) ×3 IMPLANT
CORDS BIPOLAR (ELECTRODE) ×3 IMPLANT
COVER BACK TABLE 60X90IN (DRAPES) ×3 IMPLANT
COVER MAYO STAND STRL (DRAPES) ×3 IMPLANT
CUFF TOURNIQUET SINGLE 18IN (TOURNIQUET CUFF) IMPLANT
CUFF TOURNIQUET SINGLE 24IN (TOURNIQUET CUFF) IMPLANT
DRAPE C-ARM 42X72 X-RAY (DRAPES) ×3 IMPLANT
DRAPE EXTREMITY T 121X128X90 (DRAPE) ×3 IMPLANT
DRAPE SURG 17X23 STRL (DRAPES) ×3 IMPLANT
DRSG ADAPTIC 3X8 NADH LF (GAUZE/BANDAGES/DRESSINGS) ×3 IMPLANT
DRSG EMULSION OIL 3X3 NADH (GAUZE/BANDAGES/DRESSINGS) IMPLANT
ELECT REM PT RETURN 9FT ADLT (ELECTROSURGICAL) ×3
ELECTRODE REM PT RTRN 9FT ADLT (ELECTROSURGICAL) ×1 IMPLANT
GAUZE SPONGE 4X4 12PLY STRL (GAUZE/BANDAGES/DRESSINGS) ×3 IMPLANT
GLOVE BIO SURGEON STRL SZ7.5 (GLOVE) ×3 IMPLANT
GLOVE BIOGEL PI IND STRL 7.0 (GLOVE) ×1 IMPLANT
GLOVE BIOGEL PI IND STRL 8 (GLOVE) ×1 IMPLANT
GLOVE BIOGEL PI INDICATOR 7.0 (GLOVE) ×2
GLOVE BIOGEL PI INDICATOR 8 (GLOVE) ×2
GLOVE ECLIPSE 6.5 STRL STRAW (GLOVE) ×3 IMPLANT
GOWN STRL REUS W/TWL XL LVL3 (GOWN DISPOSABLE) ×3 IMPLANT
K-WIRE COCR 1.1X105 (WIRE) ×3
KWIRE COCR 1.1X105 (WIRE) ×1 IMPLANT
NEEDLE 16GX1 1/2 (NEEDLE) IMPLANT
NEEDLE HYPO 25X1 1.5 SAFETY (NEEDLE) IMPLANT
NS IRRIG 1000ML POUR BTL (IV SOLUTION) ×3 IMPLANT
PACK BASIN DAY SURGERY FS (CUSTOM PROCEDURE TRAY) ×3 IMPLANT
PADDING CAST ABS 4INX4YD NS (CAST SUPPLIES)
PADDING CAST ABS COTTON 4X4 ST (CAST SUPPLIES) IMPLANT
PENCIL BUTTON HOLSTER BLD 10FT (ELECTRODE) ×3 IMPLANT
RUBBERBAND STERILE (MISCELLANEOUS) IMPLANT
SCREW CANN MAX VPC 3.4X20 (Screw) ×1 IMPLANT
SCREW VPS 3.4X20MM (Screw) ×2 IMPLANT
SLEEVE SCD COMPRESS KNEE MED (MISCELLANEOUS) ×3 IMPLANT
STOCKINETTE 6  STRL (DRAPES) ×2
STOCKINETTE 6 STRL (DRAPES) ×1 IMPLANT
SUT VIC AB 2-0 CT3 27 (SUTURE) ×3 IMPLANT
SUT VICRYL 4-0 PS2 18IN ABS (SUTURE) IMPLANT
SUT VICRYL RAPIDE 4-0 (SUTURE) IMPLANT
SUT VICRYL RAPIDE 4/0 PS 2 (SUTURE) ×3 IMPLANT
SYR BULB 3OZ (MISCELLANEOUS) ×3 IMPLANT
SYRINGE 10CC LL (SYRINGE) IMPLANT
TOWEL OR 17X24 6PK STRL BLUE (TOWEL DISPOSABLE) ×3 IMPLANT
TOWEL OR NON WOVEN STRL DISP B (DISPOSABLE) ×3 IMPLANT
UNDERPAD 30X30 (UNDERPADS AND DIAPERS) ×3 IMPLANT

## 2015-06-22 NOTE — Interval H&P Note (Signed)
History and Physical Interval Note:  06/22/2015 7:38 AM  Robert Black  has presented today for surgery, with the diagnosis of RIGHT SCAPHOID FRACTURE S62.022A  The various methods of treatment have been discussed with the patient and family. After consideration of risks, benefits and other options for treatment, the patient has consented to  Procedure(s) with comments: OPEN TREATMENT OF RIGHT SCAPHOID FRACTURE (Right) - PREOP BLOCK as a surgical intervention .  The patient's history has been reviewed, patient examined, no change in status, stable for surgery.  I have reviewed the patient's chart and labs.  Questions were answered to the patient's satisfaction.     [name] A.

## 2015-06-22 NOTE — Anesthesia Preprocedure Evaluation (Addendum)
Anesthesia Evaluation  Patient identified by MRN, date of birth, ID band Patient awake    Reviewed: Allergy & Precautions, NPO status   Airway Mallampati: I  TM Distance: >3 FB Neck ROM: Full    Dental  (+) Teeth Intact, Dental Advisory Given   Pulmonary asthma ,    breath sounds clear to auscultation       Cardiovascular  Rhythm:Regular Rate:Normal     Neuro/Psych    GI/Hepatic   Endo/Other  Hypothyroidism Morbid obesity  Renal/GU      Musculoskeletal   Abdominal   Peds  Hematology   Anesthesia Other Findings   Reproductive/Obstetrics                            Anesthesia Physical Anesthesia Plan  ASA: II  Anesthesia Plan: General   Post-op Pain Management: MAC Combined w/ Regional for Post-op pain   Induction: Intravenous  Airway Management Planned: LMA  Additional Equipment:   Intra-op Plan:   Post-operative Plan: Extubation in OR  Informed Consent: I have reviewed the patients History and Physical, chart, labs and discussed the procedure including the risks, benefits and alternatives for the proposed anesthesia with the patient or authorized representative who has indicated his/her understanding and acceptance.   Dental advisory given  Plan Discussed with: CRNA, Anesthesiologist and Surgeon  Anesthesia Plan Comments:         Anesthesia Quick Evaluation

## 2015-06-22 NOTE — Transfer of Care (Signed)
Immediate Anesthesia Transfer of Care Note  Patient: Robert Black  Procedure(s) Performed: Procedure(s) with comments: OPEN TREATMENT OF LEFT SCAPHOID FRACTURE (Left) - PREOP BLOCK  Patient Location: PACU  Anesthesia Type:GA combined with regional for post-op pain  Level of Consciousness: awake and patient cooperative  Airway & Oxygen Therapy: Patient Spontanous Breathing and Patient connected to face mask oxygen  Post-op Assessment: Report given to RN and Post -op Vital signs reviewed and stable  Post vital signs: Reviewed and stable  Last Vitals:  Filed Vitals:   06/22/15 0636 06/22/15 0638  BP: 132/81   Pulse:  71  Temp:  36.7 C  Resp:  16    Complications: No apparent anesthesia complications

## 2015-06-22 NOTE — Anesthesia Procedure Notes (Addendum)
Procedure Name: LMA Insertion Date/Time: 06/22/2015 7:44 AM Performed by: BLOCKER, TIMOTHY D Pre-anesthesia Checklist: Patient identified, Emergency Drugs available, Suction available and Patient being monitored Patient Re-evaluated:Patient Re-evaluated prior to inductionOxygen Delivery Method: Circle System Utilized Preoxygenation: Pre-oxygenation with 100% oxygen Intubation Type: IV induction Ventilation: Mask ventilation without difficulty LMA: LMA inserted LMA Size: 5.0 Number of attempts: 1 Airway Equipment and Method: Bite block Placement Confirmation: positive ETCO2 Tube secured with: Tape Dental Injury: Teeth and Oropharynx as per pre-operative assessment    Anesthesia Regional Block:  Supraclavicular block  Pre-Anesthetic Checklist: ,, timeout performed, Correct Patient, Correct Site, Correct Laterality, Correct Procedure, Correct Position, site marked, Risks and benefits discussed,  Surgical consent,  Pre-op evaluation,  At surgeon's request and post-op pain management  Laterality: Left and Upper  Prep: chloraprep       Needles:  Injection technique: Single-shot  Needle Type: Echogenic Stimulator Needle     Needle Length: 5cm 5 cm Needle Gauge: 21 and 21 G    Additional Needles:  Procedures: ultrasound guided (picture in chart) Supraclavicular block Narrative:  Start time: 06/22/2015 7:04 AM End time: 06/22/2015 7:10 AM Injection made incrementally with aspirations every 5 mL.  Performed by: Personally  Anesthesiologist: Lovell Roe     Left SCB image

## 2015-06-22 NOTE — Op Note (Signed)
06/22/2015  7:40 AM  PATIENT:  Robert Black  20 y.o. male  PRE-OPERATIVE DIAGNOSIS:  Subacute left scaphoid fracture  POST-OPERATIVE DIAGNOSIS:  Same  PROCEDURE:  ORIF subacute left scaphoid fracture  SURGEON: Cliffton Asters. Janee Morn, MD  PHYSICIAN ASSISTANT: Danielle Rankin, OPA-C  ANESTHESIA:  regional and general  SPECIMENS:  None  DRAINS:   None  EBL:  less than 50 mL  PREOPERATIVE INDICATIONS:  [name] is a  20 y.o. male with mildly displaced 26-month-old scaphoid fracture  The risks benefits and alternatives were discussed with the patient preoperatively including but not limited to the risks of infection, bleeding, nerve injury, cardiopulmonary complications, the need for revision surgery, among others, and the patient verbalized understanding and consented to proceed.  OPERATIVE IMPLANTS: Biomet Maxim VPC screw, 3.0 x 20  OPERATIVE PROCEDURE:  After receiving prophylactic antibiotics and a regional block, the patient was escorted to the operative theatre and placed in a supine position. General anesthesia was administered. A surgical "time-out" was performed during which the planned procedure, proposed operative site, and the correct patient identity were compared to the operative consent and agreement confirmed by the circulating nurse according to current facility policy.  Following application of a tourniquet to the operative extremity, the exposed skin was prepped with Chloraprep and draped in the usual sterile fashion.  The limb was exsanguinated with an Esmarch bandage and the tourniquet inflated to approximately higher than systolic BP.  A small less than 1 cm incision was made over the scapholunate interval. Spreading dissection was carried down to the capsule. A 16-gauge needle was then inserted to find the starting point for guidewire placement. Its position was adjusted fluoroscopically and ultimately a guidewire was driven down the central axis of  the scaphoid, stopping at the fracture site. A 0.062 inch K wire was driven into both distal and proximal poles of the scaphoid and used to help further reduce and compress the fracture and the guide pin was driven across the fracture to the distal margin of the scaphoid. The proper length screw was measured and the guidepin was driven out the volar surface of the scaphoid until it was palpable beneath the skin of the thumb and thenar region. The 2 K wires used for reduction were removed, and the path for the screw was created with the cannulated drill. A size 20 screw was placed, and proper placement confirmed fluoroscopically. Images were saved. The reduction was judged to be near-anatomic. Tourniquet was released, some additional hemostasis obtained with bipolar electrocautery and the wound was irrigated. The skin was closed with 4-0 Vicryl Rapide and a short arm thumb spica splint dressing was applied. He was awakened and taken to the recovery room stable condition, breathing spontaneously  DISPOSITION: He'll be discharged home today with typical instructions, returning in 10-15 days with new x-rays of the left wrist 2 including a scaphoid view out of splint, and then transition to a short arm cast.

## 2015-06-22 NOTE — Progress Notes (Signed)
  Assisted Dr. Crews with left, ultrasound guided, supraclavicular block. Side rails up, monitors on throughout procedure. See vital signs in flow sheet. Tolerated Procedure well. 

## 2015-06-22 NOTE — Anesthesia Postprocedure Evaluation (Signed)
Anesthesia Post Note  Patient: Robert Black  Procedure(s) Performed: Procedure(s) (LRB): OPEN TREATMENT OF LEFT SCAPHOID FRACTURE (Left)  Patient location during evaluation: PACU Anesthesia Type: General Level of consciousness: awake and alert Pain management: pain level controlled Vital Signs Assessment: post-procedure vital signs reviewed and stable Respiratory status: spontaneous breathing, nonlabored ventilation and respiratory function stable Cardiovascular status: blood pressure returned to baseline and stable Postop Assessment: no signs of nausea or vomiting Anesthetic complications: no    Last Vitals:  Filed Vitals:   06/22/15 0915 06/22/15 0935  BP: 137/98 144/95  Pulse: 89 88  Temp:  36.7 C  Resp: 12 16    Last Pain:  Filed Vitals:   06/22/15 0936  PainSc: 0-No pain                 Nolon Yellin A

## 2015-06-22 NOTE — Discharge Instructions (Signed)
Discharge Instructions   You have a dressing with a plaster splint incorporated in it. Move your fingers as much as possible, making a full fist and fully opening the fist. Elevate your hand to reduce pain & swelling of the digits.  Ice over the operative site may be helpful to reduce pain & swelling.  DO NOT USE HEAT. Pain medicine has been prescribed for you.  Use your medicine as needed over the first 48 hours, and then you can begin to taper your use.  You may use Tylenol in place of your prescribed pain medication, but not IN ADDITION to it. Leave the dressing in place until you return to our office.  You may shower, but keep the bandage clean & dry.  You may drive a car when you are off of prescription pain medications and can safely control your vehicle with both hands. Call our office and make an appointment for 10-15 days from surgery date.   Please call [phone] during normal business hours or [phone] after hours for any problems. Including the following:  - excessive redness of the incisions - drainage for more than 4 days - fever of more than 101.5 F  *Please note that pain medications will not be refilled after hours or on weekends.   Post Anesthesia Home Care Instructions  Activity: Get plenty of rest for the remainder of the day. A responsible adult should stay with you for 24 hours following the procedure.  For the next 24 hours, DO NOT: -Drive a car -Advertising copywriter -Drink alcoholic beverages -Take any medication unless instructed by your physician -Make any legal decisions or sign important papers.  Meals: Start with liquid foods such as gelatin or soup. Progress to regular foods as tolerated. Avoid greasy, spicy, heavy foods. If nausea and/or vomiting occur, drink only clear liquids until the nausea and/or vomiting subsides. Call your physician if vomiting continues.  Special Instructions/Symptoms: Your throat may feel dry or sore from the  anesthesia or the breathing tube placed in your throat during surgery. If this causes discomfort, gargle with warm salt water. The discomfort should disappear within 24 hours.  If you had a scopolamine patch placed behind your ear for the management of post- operative nausea and/or vomiting:  1. The medication in the patch is effective for 72 hours, after which it should be removed.  Wrap patch in a tissue and discard in the trash. Wash hands thoroughly with soap and water. 2. You may remove the patch earlier than 72 hours if you experience unpleasant side effects which may include dry mouth, dizziness or visual disturbances. 3. Avoid touching the patch. Wash your hands with soap and water after contact with the patch.   Regional Anesthesia Blocks  1. Numbness or the inability to move the "blocked" extremity may last from 3-48 hours after placement. The length of time depends on the medication injected and your individual response to the medication. If the numbness is not going away after 48 hours, call your surgeon.  2. The extremity that is blocked will need to be protected until the numbness is gone and the  Strength has returned. Because you cannot feel it, you will need to take extra care to avoid injury. Because it may be weak, you may have difficulty moving it or using it. You may not know what position it is in without looking at it while the block is in effect.  3. For blocks in the legs and feet, returning to weight bearing  and walking needs to be done carefully. You will need to wait until the numbness is entirely gone and the strength has returned. You should be able to move your leg and foot normally before you try and bear weight or walk. You will need someone to be with you when you first try to ensure you do not fall and possibly risk injury.  4. Bruising and tenderness at the needle site are common side effects and will resolve in a few days.  5. Persistent numbness or new problems  with movement should be communicated to the surgeon or the [facility] [phone] [facility] [phone]).

## 2015-06-23 ENCOUNTER — Encounter (HOSPITAL_BASED_OUTPATIENT_CLINIC_OR_DEPARTMENT_OTHER): Payer: Self-pay | Admitting: Orthopedic Surgery

## 2015-08-04 ENCOUNTER — Emergency Department (HOSPITAL_COMMUNITY): Payer: Medicaid Other

## 2015-08-04 ENCOUNTER — Encounter (HOSPITAL_COMMUNITY): Payer: Self-pay | Admitting: Emergency Medicine

## 2015-08-04 ENCOUNTER — Emergency Department (HOSPITAL_COMMUNITY)
Admission: EM | Admit: 2015-08-04 | Discharge: 2015-08-04 | Disposition: A | Discharge status: Home / Self Care | Payer: Medicaid Other | Attending: Emergency Medicine | Admitting: Emergency Medicine

## 2015-08-04 DIAGNOSIS — E039 Hypothyroidism, unspecified: Secondary | ICD-10-CM | POA: Insufficient documentation

## 2015-08-04 DIAGNOSIS — R0981 Nasal congestion: Secondary | ICD-10-CM | POA: Insufficient documentation

## 2015-08-04 DIAGNOSIS — R079 Chest pain, unspecified: Secondary | ICD-10-CM | POA: Diagnosis present

## 2015-08-04 DIAGNOSIS — R05 Cough: Secondary | ICD-10-CM | POA: Diagnosis not present

## 2015-08-04 DIAGNOSIS — R0789 Other chest pain: Secondary | ICD-10-CM | POA: Insufficient documentation

## 2015-08-04 DIAGNOSIS — Z79899 Other long term (current) drug therapy: Secondary | ICD-10-CM | POA: Insufficient documentation

## 2015-08-04 DIAGNOSIS — K219 Gastro-esophageal reflux disease without esophagitis: Secondary | ICD-10-CM | POA: Diagnosis not present

## 2015-08-04 DIAGNOSIS — J45901 Unspecified asthma with (acute) exacerbation: Secondary | ICD-10-CM | POA: Diagnosis not present

## 2015-08-04 LAB — CBC
HEMATOCRIT: 43.1 % (ref 39.0–52.0)
HEMOGLOBIN: 15.1 g/dL (ref 13.0–17.0)
MCH: 30.2 pg (ref 26.0–34.0)
MCHC: 35 g/dL (ref 30.0–36.0)
MCV: 86.2 fL (ref 78.0–100.0)
Platelets: 234 10*3/uL (ref 150–400)
RBC: 5 MIL/uL (ref 4.22–5.81)
RDW: 11.7 % (ref 11.5–15.5)
WBC: 6.3 10*3/uL (ref 4.0–10.5)

## 2015-08-04 LAB — BASIC METABOLIC PANEL
ANION GAP: 12 (ref 5–15)
BUN: 11 mg/dL (ref 6–20)
CHLORIDE: 104 mmol/L (ref 101–111)
CO2: 24 mmol/L (ref 22–32)
Calcium: 9.5 mg/dL (ref 8.9–10.3)
Creatinine, Ser: 0.94 mg/dL (ref 0.61–1.24)
GFR calc non Af Amer: >60 mL/min (ref >60)
GLUCOSE: 98 mg/dL (ref 65–99)
POTASSIUM: 3.8 mmol/L (ref 3.5–5.1)
Sodium: 140 mmol/L (ref 135–145)

## 2015-08-04 LAB — I-STAT TROPONIN, ED: Troponin i, poc: 0 ng/mL (ref 0.00–0.08)

## 2015-08-04 MED ORDER — GI COCKTAIL ~~LOC~~
30.0000 mL | Freq: Once | ORAL | Status: AC
  Administered 2015-08-04: 30 mL via ORAL
  Filled 2015-08-04: qty 30

## 2015-08-04 MED ORDER — OMEPRAZOLE 20 MG PO CPDR: 20.0000 mg | DELAYED_RELEASE_CAPSULE | Freq: Every day | ORAL | Status: DC

## 2015-08-04 MED ORDER — KETOROLAC TROMETHAMINE 30 MG/ML IJ SOLN
30.0000 mg | Freq: Once | INTRAMUSCULAR | Status: AC
  Administered 2015-08-04: 30 mg via INTRAMUSCULAR
  Filled 2015-08-04: qty 1

## 2015-08-04 NOTE — Discharge Instructions (Signed)
Nonspecific Chest Pain  °Chest pain can be caused by many different conditions. There is always a chance that your pain could be related to something serious, such as a heart attack or a blood clot in your lungs. Chest pain can also be caused by conditions that are not life-threatening. If you have chest pain, it is very important to follow up with your health care provider. °CAUSES  °Chest pain can be caused by: °· Heartburn. °· Pneumonia or bronchitis. °· Anxiety or stress. °· Inflammation around your heart (pericarditis) or lung (pleuritis or pleurisy). °· A blood clot in your lung. °· A collapsed lung (pneumothorax). It can develop suddenly on its own (spontaneous pneumothorax) or from trauma to the chest. °· Shingles infection (varicella-zoster virus). °· Heart attack. °· Damage to the bones, muscles, and cartilage that make up your chest wall. This can include: °¨ Bruised bones due to injury. °¨ Strained muscles or cartilage due to frequent or repeated coughing or overwork. °¨ Fracture to one or more ribs. °¨ Sore cartilage due to inflammation (costochondritis). °RISK FACTORS  °Risk factors for chest pain may include: °· Activities that increase your risk for trauma or injury to your chest. °· Respiratory infections or conditions that cause frequent coughing. °· Medical conditions or overeating that can cause heartburn. °· Heart disease or family history of heart disease. °· Conditions or health behaviors that increase your risk of developing a blood clot. °· Having had chicken pox (varicella zoster). °SIGNS AND SYMPTOMS °Chest pain can feel like: °· Burning or tingling on the surface of your chest or deep in your chest. °· Crushing, pressure, aching, or squeezing pain. °· Dull or sharp pain that is worse when you move, cough, or take a deep breath. °· Pain that is also felt in your back, neck, shoulder, or arm, or pain that spreads to any of these areas. °Your chest pain may come and go, or it may stay  constant. °DIAGNOSIS °Lab tests or other studies may be needed to find the cause of your pain. Your health care provider may have you take a test called an ambulatory ECG (electrocardiogram). An ECG records your heartbeat patterns at the time the test is performed. You may also have other tests, such as: °· Transthoracic echocardiogram (TTE). During echocardiography, sound waves are used to create a picture of all of the heart structures and to look at how blood flows through your heart. °· Transesophageal echocardiogram (TEE). This is a more advanced imaging test that obtains images from inside your body. It allows your health care provider to see your heart in finer detail. °· Cardiac monitoring. This allows your health care provider to monitor your heart rate and rhythm in real time. °· Holter monitor. This is a portable device that records your heartbeat and can help to diagnose abnormal heartbeats. It allows your health care provider to track your heart activity for several days, if needed. °· Stress tests. These can be done through exercise or by taking medicine that makes your heart beat more quickly. °· Blood tests. °· Imaging tests. °TREATMENT  °Your treatment depends on what is causing your chest pain. Treatment may include: °· Medicines. These may include: °¨ Acid blockers for heartburn. °¨ Anti-inflammatory medicine. °¨ Pain medicine for inflammatory conditions. °¨ Antibiotic medicine, if an infection is present. °¨ Medicines to dissolve blood clots. °¨ Medicines to treat coronary artery disease. °· Supportive care for conditions that do not require medicines. This may include: °¨ Resting. °¨ Applying heat   or cold packs to injured areas. °¨ Limiting activities until pain decreases. °HOME CARE INSTRUCTIONS °· If you were prescribed an antibiotic medicine, finish it all even if you start to feel better. °· Avoid any activities that bring on chest pain. °· Do not use any tobacco products, including  cigarettes, chewing tobacco, or electronic cigarettes. If you need help quitting, ask your health care provider. °· Do not drink alcohol. °· Take medicines only as directed by your health care provider. °· Keep all follow-up visits as directed by your health care provider. This is important. This includes any further testing if your chest pain does not go away. °· If heartburn is the cause for your chest pain, you may be told to keep your head raised (elevated) while sleeping. This reduces the chance that acid will go from your stomach into your esophagus. °· Make lifestyle changes as directed by your health care provider. These may include: °¨ Getting regular exercise. Ask your health care provider to suggest some activities that are safe for you. °¨ Eating a heart-healthy diet. A registered dietitian can help you to learn healthy eating options. °¨ Maintaining a healthy weight. °¨ Managing diabetes, if necessary. °¨ Reducing stress. °SEEK MEDICAL CARE IF: °· Your chest pain does not go away after treatment. °· You have a rash with blisters on your chest. °· You have a fever. °SEEK IMMEDIATE MEDICAL CARE IF:  °· Your chest pain is worse. °· You have an increasing cough, or you cough up blood. °· You have severe abdominal pain. °· You have severe weakness. °· You faint. °· You have chills. °· You have sudden, unexplained chest discomfort. °· You have sudden, unexplained discomfort in your arms, back, neck, or jaw. °· You have shortness of breath at any time. °· You suddenly start to sweat, or your skin gets clammy. °· You feel nauseous or you vomit. °· You suddenly feel light-headed or dizzy. °· Your heart begins to beat quickly, or it feels like it is skipping beats. °These symptoms may represent a serious problem that is an emergency. Do not wait to see if the symptoms will go away. Get medical help right away. Call your local emergency services (911 in the [place] Do not drive yourself to the hospital. °  °This  information is not intended to replace advice given to you by your health care provider. Make sure you discuss any questions you have with your health care provider. °  °Document Released: 02/09/2005 Document Revised: 05/23/2014 Document Reviewed: 12/06/2013 °Elsevier Interactive Patient Education ©2016 Elsevier Inc. ° °

## 2015-08-04 NOTE — ED Notes (Signed)
Pt.name was called x2 no answer

## 2015-08-04 NOTE — ED Notes (Signed)
Pt. reports left chest pain with mild SOB and occasional dry cough and chest congestion , denies nausea or diaphoresis .

## 2015-08-04 NOTE — ED Provider Notes (Signed)
CSN: [id number]     Arrival date & time 08/04/15  0101 History   First MD Initiated Contact with Patient 08/04/15 0540     Chief Complaint  Patient presents with  . Chest Pain     (Consider location/radiation/quality/duration/timing/severity/associated sxs/prior Treatment) HPI  This is a 20 year old male who presents with chest pain. Patient states onset of symptoms were Saturday. He states he noted them after eating and they were persistent throughout the night but improved on Sunday. However, they returned. He reports left anterior chest pain that is nonradiating. It is dull. Currently the pain is 7 out of 10. He has not taken anything for the pain. Patient does note that he had upper respiratory symptoms last week including cough and congestion. He states that he is on antibiotic for sore throat. Patient denies history of PE or leg swelling. He did have left wrist surgery 6 weeks ago but has not been immobile since.  Past Medical History  Diagnosis Date  . Thyroid disease   . Asthma     as a child  . Hypothyroidism    Past Surgical History  Procedure Laterality Date  . Tonsillectomy    . Orif scaphoid fracture Left 06/22/2015    Procedure: OPEN TREATMENT OF LEFT SCAPHOID FRACTURE;  Surgeon: Mack Hookavid Thompson, MD;  Location: [facility];  Service: Orthopedics;  Laterality: Left;  PREOP BLOCK   Family History  Problem Relation Age of Onset  . Thyroid disease Mother     hypothyroid  . Obesity Mother   . Thyroid disease Maternal Grandmother   . Hypertension Maternal Grandmother    Social History  Substance Use Topics  . Smoking status: Never Smoker   . Smokeless tobacco: Never Used  . Alcohol Use: Yes    Review of Systems  Constitutional: Negative for fever.  Respiratory: Positive for cough and shortness of breath.   Cardiovascular: Positive for chest pain. Negative for leg swelling.  Gastrointestinal: Negative for nausea, vomiting and abdominal pain.  All other  systems reviewed and are negative.     Allergies  Review of patient's allergies indicates no known allergies.  Home Medications   Prior to Admission medications   Medication Sig Start Date End Date Taking? Authorizing Provider  levothyroxine (SYNTHROID, LEVOTHROID) 25 MCG tablet Take 1 tablet (25 mcg total) by mouth daily. 05/14/15  Yes Reather LittlerAjay Kumar, MD  omeprazole (PRILOSEC) 20 MG capsule Take 1 capsule (20 mg total) by mouth daily. 08/04/15   Shon Batonourtney F Horton, MD   BP 141/83 mmHg  Pulse 67  Temp(Src) 98.8 F (37.1 C) (Oral)  Resp 9  Ht 6' (1.829 m)  Wt 236 lb 4 oz (107.162 kg)  BMI 32.03 kg/m2  SpO2 99% Physical Exam  Constitutional: He is oriented to person, place, and time. He appears well-developed and well-nourished. No distress.  HENT:  Head: Normocephalic and atraumatic.  Cardiovascular: Normal rate, regular rhythm and normal heart sounds.   No murmur heard. Pulmonary/Chest: Effort normal and breath sounds normal. No respiratory distress. He has no wheezes. He exhibits no tenderness.  Abdominal: Soft. There is no tenderness.  Musculoskeletal: He exhibits no edema.  Neurological: He is alert and oriented to person, place, and time.  Skin: Skin is warm and dry.  Psychiatric: He has a normal mood and affect.  Nursing note and vitals reviewed.   ED Course  Procedures (including critical care time) Labs Review Labs Reviewed  BASIC METABOLIC PANEL  CBC  I-STAT TROPOININ, ED  Imaging Review Dg Chest 2 View  08/04/2015  CLINICAL DATA:  Left-sided chest pain today. Cough and fever for 2 days. EXAM: CHEST  2 VIEW COMPARISON:  04/02/2011 FINDINGS: The heart size and mediastinal contours are within normal limits. Both lungs are clear. The visualized skeletal structures are unremarkable. IMPRESSION: No active cardiopulmonary disease. Electronically Signed   By: Burman Nieves M.D.   On: 08/04/2015 01:53   I have personally reviewed and evaluated these images and lab  results as part of my medical decision-making.   EKG Interpretation   Date/Time:  Tuesday August 04 2015 01:12:10 EDT Ventricular Rate:  84 PR Interval:  130 QRS Duration: 90 QT Interval:  370 QTC Calculation: 437 R Axis:   10 Text Interpretation:  Normal sinus rhythm Normal ECG Confirmed by HORTON   MD, COURTNEY ([id number]) on 08/04/2015 5:40:46 AM      MDM   Final diagnoses:  Other chest pain  Gastroesophageal reflux disease, esophagitis presence not specified    Patient presents with chest pain. Somewhat associated with food. Also reports recent upper respiratory symptoms. Nontoxic on exam. Afebrile. Vital signs and physical exam are unremarkable. Suspect GERD versus musculoskeletal versus upper respiratory etiology. PE is a consideration; however, the patient is low risk. While he did have recent surgery, the surgery was low risk and was over 6 weeks ago. Will not obtain a d-dimer at this time given that he is otherwise PERC neg.  patient was given a GI cocktail and Toradol. States that he had some improvement with a GI cocktail.  Will start on omeprazole. Patient was given strict return precautions.  After history, exam, and medical workup I feel the patient has been appropriately medically screened and is safe for discharge home. Pertinent diagnoses were discussed with the patient. Patient was given return precautions.   Shon Baton, MD 08/04/15 [phone]

## 2015-08-12 ENCOUNTER — Ambulatory Visit (INDEPENDENT_AMBULATORY_CARE_PROVIDER_SITE_OTHER): Payer: Medicaid Other | Admitting: Family Medicine

## 2015-08-12 ENCOUNTER — Encounter: Payer: Self-pay | Admitting: Family Medicine

## 2015-08-12 VITALS — BP 123/67 | HR 82 | Temp 98.3°F | Ht 72.0 in | Wt 232.4 lb

## 2015-08-12 DIAGNOSIS — R109 Unspecified abdominal pain: Secondary | ICD-10-CM | POA: Insufficient documentation

## 2015-08-12 DIAGNOSIS — R1031 Right lower quadrant pain: Secondary | ICD-10-CM

## 2015-08-12 NOTE — Assessment & Plan Note (Signed)
Suspect abdominal wall pain given worsening with movement and coughing Patient is mildly tender to palpation in the right mid abdomen, but otherwise benign abdominal exam Discussed ibuprofen use as needed Return precautions given

## 2015-08-12 NOTE — Patient Instructions (Signed)
Abdominal Pain, Adult Many things can cause abdominal pain. Usually, abdominal pain is not caused by a disease and will improve without treatment. It can often be observed and treated at home. Your health care provider will do a physical exam and possibly order blood tests and X-rays to help determine the seriousness of your pain. However, in many cases, more time must pass before a clear cause of the pain can be found. Before that point, your health care provider may not know if you need more testing or further treatment. HOME CARE INSTRUCTIONS Monitor your abdominal pain for any changes. The following actions may help to alleviate any discomfort you are experiencing:  Only take over-the-counter or prescription medicines as directed by your health care provider.  Do not take laxatives unless directed to do so by your health care provider.  Try a clear liquid diet (broth, tea, or water) as directed by your health care provider. Slowly move to a bland diet as tolerated. SEEK MEDICAL CARE IF:  You have unexplained abdominal pain.  You have abdominal pain associated with nausea or diarrhea.  You have pain when you urinate or have a bowel movement.  You experience abdominal pain that wakes you in the night.  You have abdominal pain that is worsened or improved by eating food.  You have abdominal pain that is worsened with eating fatty foods.  You have a fever. SEEK IMMEDIATE MEDICAL CARE IF:  You keep throwing up (vomiting).  Your pain is felt only in portions of the abdomen, such as the right side or the left lower portion of the abdomen.  You pass bloody or black tarry stools. MAKE SURE YOU:  Understand these instructions.  Will watch your condition.  Will get help right away if you are not doing well or get worse.   This information is not intended to replace advice given to you by your health care provider. Make sure you discuss any questions you have with your health care  provider.   Document Released: 02/09/2005 Document Revised: 01/21/2015 Document Reviewed: 01/09/2013 Elsevier Interactive Patient Education [employer]2016 Elsevier Inc.

## 2015-08-12 NOTE — Progress Notes (Signed)
   Subjective:   Robert SeatsChristian Black is a 20 y.o. male with a history of Hypothyroidism, obesity here for abdominal pain.  Patient reports 2-3 days of intermittent cramping right lower quadrant and left mid abdominal cramping. Pain is worse with sitting, standing, movement, coughing. Patient reports the pain is mild and does not affect his sleep. It occurs about 10 times per day and only last a few minutes. He reports she's never had anything like this before. He denies any urinary symptoms, constipation, diarrhea, nausea, vomiting, fevers. He reports having a normal bowel movement yesterday.   Review of Systems:  Per HPI.   Social History: Never smoker  Objective:  BP 123/67 mmHg  Pulse 82  Temp(Src) 98.3 F (36.8 C) (Oral)  Ht 6' (1.829 m)  Wt 232 lb 6.4 oz (105.416 kg)  BMI 31.51 kg/m2  Gen:  20 y.o. male in NAD HEENT: NCAT, MMM CV: RRR, no MRG Resp: Non-labored, CTAB, no wheezes noted Abd: Soft, ND, mildly TTP in R mid abdomen, BS present, no guarding or organomegaly Ext: WWP, no edema MSK: No obvious deformities Neuro: Alert and oriented, speech normal    Assessment & Plan:     Robert SeatsChristian Black is a 20 y.o. male here for abd pain  Abdominal pain Suspect abdominal wall pain given worsening with movement and coughing Patient is mildly tender to palpation in the right mid abdomen, but otherwise benign abdominal exam Discussed ibuprofen use as needed Return precautions given    Erasmo DownerAngela M Bacigalupo, MD MPH PGY-2,  [facility] 08/12/2015  11:51 AM

## 2015-12-06 ENCOUNTER — Other Ambulatory Visit: Payer: Self-pay | Admitting: Endocrinology

## 2015-12-06 NOTE — Telephone Encounter (Signed)
For Dr Ellison 

## 2015-12-28 ENCOUNTER — Telehealth: Payer: Self-pay | Admitting: Endocrinology

## 2015-12-28 MED ORDER — LEVOTHYROXINE SODIUM 25 MCG PO TABS: 25.0000 ug | ORAL_TABLET | Freq: Every day | ORAL | 1 refills | Status: DC

## 2015-12-28 NOTE — Telephone Encounter (Signed)
PT needs his Synthroid called [facility] [address]

## 2015-12-28 NOTE — Telephone Encounter (Signed)
Rx submitted per pt's request.  

## 2016-01-25 ENCOUNTER — Encounter: Payer: Medicaid Other | Admitting: Endocrinology

## 2016-02-15 ENCOUNTER — Encounter: Payer: Self-pay | Admitting: Endocrinology

## 2016-02-15 ENCOUNTER — Ambulatory Visit (INDEPENDENT_AMBULATORY_CARE_PROVIDER_SITE_OTHER): Payer: Medicaid Other | Admitting: Endocrinology

## 2016-02-15 VITALS — BP 108/70 | HR 74 | Ht 72.0 in | Wt 256.0 lb

## 2016-02-15 DIAGNOSIS — E039 Hypothyroidism, unspecified: Secondary | ICD-10-CM

## 2016-02-15 LAB — TSH: TSH: 6.33 u[IU]/mL — AB (ref 0.35–4.50)

## 2016-02-15 MED ORDER — LEVOTHYROXINE SODIUM 50 MCG PO TABS: 50.0000 ug | ORAL_TABLET | Freq: Every day | ORAL | 3 refills | Status: DC

## 2016-02-15 NOTE — Patient Instructions (Signed)
blood tests are requested for you today.  We'll let you know about the results.    Please return in 1 year.        Hypothyroidism The thyroid is a large gland located in the lower front of your neck. The thyroid gland helps control metabolism. Metabolism is how your body handles food. It controls metabolism with the hormone thyroxine. When this gland is underactive (hypothyroid), it produces too little hormone.  CAUSES These include:   Absence or destruction of thyroid tissue.  Goiter due to iodine deficiency.  Goiter due to medications.  Congenital defects (since birth).  Problems with the pituitary. This causes a lack of TSH (thyroid stimulating hormone). This hormone tells the thyroid to turn out more hormone. SYMPTOMS  Lethargy (feeling as though you have no energy)  Cold intolerance  Weight gain (in spite of normal food intake)  Dry skin  Coarse hair  Menstrual irregularity (if severe, may lead to infertility)  Slowing of thought processes Cardiac problems are also caused by insufficient amounts of thyroid hormone. Hypothyroidism in the newborn is cretinism, and is an extreme form. It is important that this form be treated adequately and immediately or it will lead rapidly to retarded physical and mental development. DIAGNOSIS  To prove hypothyroidism, your caregiver may do blood tests and ultrasound tests. Sometimes the signs are hidden. It may be necessary for your caregiver to watch this illness with blood tests either before or after diagnosis and treatment. TREATMENT  Low levels of thyroid hormone are increased by using synthetic thyroid hormone. This is a safe, effective treatment. It usually takes about four weeks to gain the full effects of the medication. After you have the full effect of the medication, it will generally take another four weeks for problems to leave. Your caregiver may start you on low doses. If you have had heart problems the dose may be  gradually increased. It is generally not an emergency to get rapidly to normal. HOME CARE INSTRUCTIONS   Take your medications as your caregiver suggests. Let your caregiver know of any medications you are taking or start taking. Your caregiver will help you with dosage schedules.  As your condition improves, your dosage needs may increase. It will be necessary to have continuing blood tests as suggested by your caregiver.  Report all suspected medication side effects to your caregiver. SEEK MEDICAL CARE IF: Seek medical care if you develop:  Sweating.  Tremulousness (tremors).  Anxiety.  Rapid weight loss.  Heat intolerance.  Emotional swings.  Diarrhea.  Weakness. SEEK IMMEDIATE MEDICAL CARE IF:  You develop chest pain, an irregular heart beat (palpitations), or a rapid heart beat. MAKE SURE YOU:   Understand these instructions.  Will watch your condition.  Will get help right away if you are not doing well or get worse. Document Released: 05/02/2005 Document Revised: 07/25/2011 Document Reviewed: 12/21/2007 [facility] Patient Information 2015 [place]. This information is not intended to replace advice given to you by your health care provider. Make sure you discuss any questions you have with your health care provider.

## 2016-02-15 NOTE — Progress Notes (Signed)
   Subjective:    Patient ID: Robert Black, male    DOB: 04/18/1996, 20 y.o.   MRN: [id number]  HPI Pt returns for f/u of chronic primary hypothyroidism (dx'ed 2014; he has been on thyroid hormone therapy since dx; he has never taken non-prescribed thyroid hormone therapy; he has never had thyroid imaging, surgery, or XRT to the neck; he has never been on amiodarone or lithium.  He misses the synthroid approx once-twice per week.  pt states he feels well in general.   Past Medical History:  Diagnosis Date  . Asthma    as a child  . Hypothyroidism   . Thyroid disease     Past Surgical History:  Procedure Laterality Date  . ORIF SCAPHOID FRACTURE Left 06/22/2015   Procedure: OPEN TREATMENT OF LEFT SCAPHOID FRACTURE;  Surgeon: Mack Hookavid Thompson, MD;  Location: [facility];  Service: Orthopedics;  Laterality: Left;  PREOP BLOCK  . TONSILLECTOMY      Social History   Social History  . Marital status: Single    Spouse name: N/A  . Number of children: N/A  . Years of education: N/A   Occupational History  . Not on file.   Social History Main Topics  . Smoking status: Never Smoker  . Smokeless tobacco: Never Used  . Alcohol use Yes  . Drug use: No  . Sexual activity: Yes   Other Topics Concern  . Not on file   Social History Narrative   Graduated from high school   Works [employer] representative[employer]   Lives with mom, brother, sister, nephew    Current Outpatient Prescriptions on File Prior to Visit  Medication Sig Dispense Refill  . omeprazole (PRILOSEC) 20 MG capsule Take 1 capsule (20 mg total) by mouth daily. 30 capsule 0   No current facility-administered medications on file prior to visit.     No Known Allergies  Family History  Problem Relation Age of Onset  . Thyroid disease Mother     hypothyroid  . Obesity Mother   . Thyroid disease Maternal Grandmother   . Hypertension Maternal Grandmother     BP 108/70   Pulse 74   Ht 6' (1.829 m)   Wt 256 lb (116.1  kg)   SpO2 97%   BMI 34.72 kg/m   Review of Systems No weight change    Objective:   Physical Exam VITAL SIGNS:  See vs page GENERAL: no distress NECK: There is no palpable thyroid enlargement.  No thyroid nodule is palpable.  No palpable lymphadenopathy at the anterior neck.  Lab Results  Component Value Date   TSH 6.33 (H) 02/15/2016      Assessment & Plan:  Hypothyroidism: he needs increased rx.  I have sent a prescription to your pharmacy

## 2016-03-17 ENCOUNTER — Other Ambulatory Visit: Payer: Medicaid Other

## 2016-03-29 IMAGING — DX DG TOE 5TH 2+V*L*
3 series · 3 of 3 positions shown · non-contrast
Comparison: 01/31/2006.

CLINICAL DATA: 19-year-old male with left fifth toe injury after
jamming it into a wall earlier today.

EXAM:
DG TOE 5TH LEFT

[toe ap]
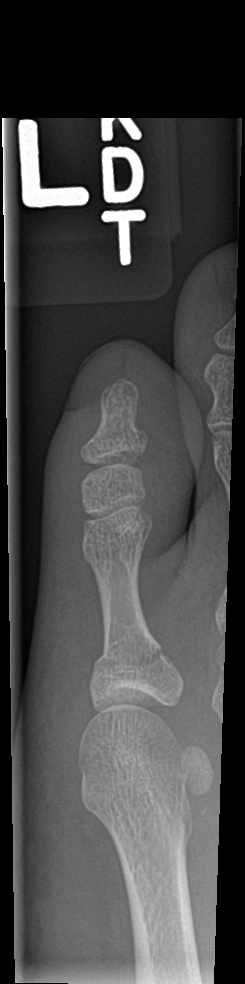

[toe obl]
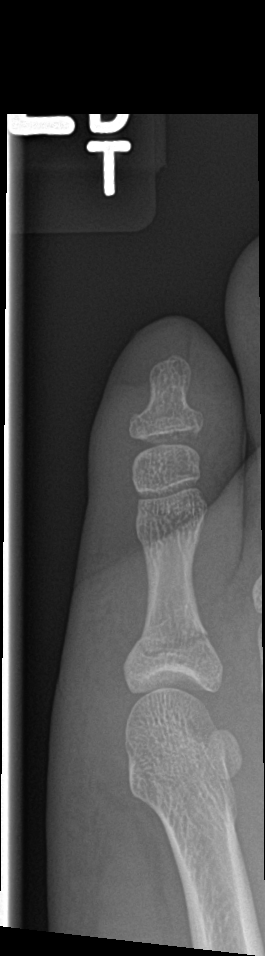

[toe lat]
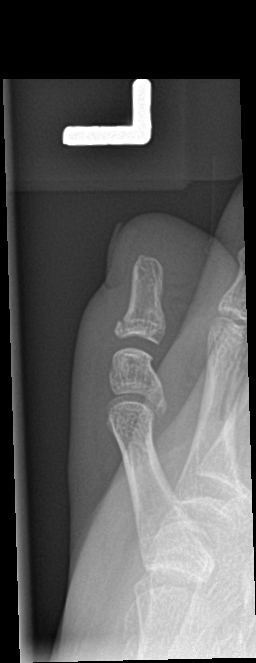

[3 of 3 positions shown; findings below may reference images not displayed]

FINDINGS: There is an acute nondisplaced transverse fracture through the
proximal metaphysis of the fifth proximal phalanx. This does not
appear to extend to the articular surface at the fifth MTP joint. In
addition, on the lateral projection, there is a subtle nondisplaced
fracture through the dorsal aspect of the proximal fifth distal
phalanx, which does appear to extend to the articular surface. Soft
tissues are diffusely swollen around the fifth digit.
IMPRESSION: 1. Acute nondisplaced transverse fracture through the proximal
metaphysis of the fifth proximal phalanx.
2. Acute nondisplaced oblique fracture through the dorsal aspect of
the proximal fifth distal phalanx which appears to be
intra-articular.

## 2016-08-22 ENCOUNTER — Ambulatory Visit: Payer: Medicaid Other | Admitting: Endocrinology

## 2016-10-02 ENCOUNTER — Emergency Department (HOSPITAL_COMMUNITY)
Admission: EM | Admit: 2016-10-02 | Discharge: 2016-10-03 | Disposition: A | Discharge status: Home / Self Care | Payer: Medicaid Other | Attending: Emergency Medicine | Admitting: Emergency Medicine

## 2016-10-02 ENCOUNTER — Encounter (HOSPITAL_COMMUNITY): Payer: Self-pay | Admitting: Emergency Medicine

## 2016-10-02 ENCOUNTER — Emergency Department (HOSPITAL_COMMUNITY): Payer: Medicaid Other

## 2016-10-02 DIAGNOSIS — E039 Hypothyroidism, unspecified: Secondary | ICD-10-CM | POA: Insufficient documentation

## 2016-10-02 DIAGNOSIS — R0789 Other chest pain: Secondary | ICD-10-CM | POA: Insufficient documentation

## 2016-10-02 DIAGNOSIS — J45909 Unspecified asthma, uncomplicated: Secondary | ICD-10-CM | POA: Insufficient documentation

## 2016-10-02 LAB — BASIC METABOLIC PANEL
Anion gap: 8 (ref 5–15)
BUN: 7 mg/dL (ref 6–20)
CHLORIDE: 105 mmol/L (ref 101–111)
CO2: 25 mmol/L (ref 22–32)
CREATININE: 0.91 mg/dL (ref 0.61–1.24)
Calcium: 9.1 mg/dL (ref 8.9–10.3)
GFR calc non Af Amer: >60 mL/min (ref >60)
Glucose, Bld: 104 mg/dL — ABNORMAL HIGH (ref 65–99)
Potassium: 3.5 mmol/L (ref 3.5–5.1)
SODIUM: 138 mmol/L (ref 135–145)

## 2016-10-02 LAB — CBC
HCT: 44.9 % (ref 39.0–52.0)
Hemoglobin: 15.8 g/dL (ref 13.0–17.0)
MCH: 30.7 pg (ref 26.0–34.0)
MCHC: 35.2 g/dL (ref 30.0–36.0)
MCV: 87.2 fL (ref 78.0–100.0)
Platelets: 311 10*3/uL (ref 150–400)
RBC: 5.15 MIL/uL (ref 4.22–5.81)
RDW: 12.3 % (ref 11.5–15.5)
WBC: 8.1 10*3/uL (ref 4.0–10.5)

## 2016-10-02 LAB — I-STAT TROPONIN, ED: TROPONIN I, POC: 0 ng/mL (ref 0.00–0.08)

## 2016-10-02 NOTE — ED Triage Notes (Signed)
Pt reports left sided chest tightness that radiates to the left arm lasting this past week.  Denies SOB, n/v/d, lightheadness.

## 2016-10-02 NOTE — ED Provider Notes (Signed)
[facility]EMERGENCY DEPT Provider Note   CSN: [id number] Arrival date & time: 10/02/16  2157     History   Chief Complaint Chief Complaint  Patient presents with  . Chest Pain    HPI Robert Black is a 21 y.o. male.  The history is provided by the patient.  Chest Pain   This is a recurrent problem. Episode onset: several weeks. Episode frequency: intermittent. The problem has not changed since onset.The pain is associated with movement. The pain is present in the lateral region. The pain is mild. The quality of the pain is described as brief, heavy and sharp. The pain radiates to the left shoulder. The symptoms are aggravated by certain positions. Pertinent negatives include no back pain, no cough, no headaches, no hemoptysis, no irregular heartbeat, no lower extremity edema, no malaise/fatigue, no nausea, no near-syncope, no palpitations, no shortness of breath and no vomiting. He has tried nothing for the symptoms. There are no known risk factors.  Pertinent negatives for past medical history include no cancer, no COPD, no CHF, no diabetes, no DVT, no hyperlipidemia, no hypertension, no MI and no PE.  Pertinent negatives for family medical history include: no early MI.   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   Past Medical History:  Diagnosis Date  . Asthma    as a child  . Hypothyroidism   . Thyroid disease     Patient Active Problem List   Diagnosis Date Noted  . Abdominal pain 08/12/2015  . Sore throat 04/07/2015  . Illicit drug use 02/25/2015  . Condyloma acuminata 07/30/2014  . Acute thoracic back pain 07/04/2014  . High risk sexual behavior 03/04/2014  . Back pain without radiculopathy 01/17/2014  . Obesity (BMI >32) 11/26/2013  . Hypothyroidism 06/03/2013  . Abnormal endocrine laboratory test finding 11/28/2012  . Acanthosis 11/28/2012  . Obesity, unspecified 11/28/2012    Past Surgical History:  Procedure Laterality Date  .  ORIF SCAPHOID FRACTURE Left 06/22/2015   Procedure: OPEN TREATMENT OF LEFT SCAPHOID FRACTURE;  Surgeon: Mack Hook, MD;  Location: [facility];  Service: Orthopedics;  Laterality: Left;  PREOP BLOCK  . TONSILLECTOMY         Home Medications    Prior to Admission medications   Medication Sig Start Date End Date Taking? Authorizing Provider  levothyroxine (SYNTHROID, LEVOTHROID) 50 MCG tablet Take 1 tablet (50 mcg total) by mouth daily. 02/15/16   Romero Belling, MD  omeprazole (PRILOSEC) 20 MG capsule Take 1 capsule (20 mg total) by mouth daily. 08/04/15   Horton, Mayer Masker, MD    Family History Family History  Problem Relation Age of Onset  . Thyroid disease Mother        hypothyroid  . Obesity Mother   . Thyroid disease Maternal Grandmother   . Hypertension Maternal Grandmother     Social History Social History  Substance Use Topics  . Smoking status: Never Smoker  . Smokeless tobacco: Never Used  . Alcohol use Yes     Allergies   Patient has no known allergies.   Review of Systems Review of Systems  Constitutional: Negative for malaise/fatigue.  Respiratory: Negative for cough, hemoptysis and shortness of breath.   Cardiovascular: Positive for chest pain. Negative for palpitations and near-syncope.  Gastrointestinal: Negative for nausea and vomiting.  Musculoskeletal: Negative for back pain.  Neurological: Negative for headaches.  All other systems are reviewed and are negative for acute change except  as noted in the HPI    Physical Exam Updated Vital Signs BP 133/86   Pulse 81   Temp 98.2 F (36.8 C) (Oral)   Resp 20   Ht 6' (1.829 m)   Wt 250 lb (113.4 kg)   SpO2 97%   BMI 33.91 kg/m   Physical Exam  Constitutional: He is oriented to person, place, and time. He appears well-developed and well-nourished. No distress.  HENT:  Head: Normocephalic and atraumatic.  Nose: Nose normal.  Eyes: Conjunctivae and EOM are normal. Pupils are  equal, round, and reactive to light. Right eye exhibits no discharge. Left eye exhibits no discharge. No scleral icterus.  Neck: Normal range of motion. Neck supple.  Cardiovascular: Normal rate and regular rhythm.  Exam reveals no gallop and no friction rub.   No murmur heard. Pulmonary/Chest: Effort normal and breath sounds normal. No stridor. No respiratory distress. He has no rales.    Abdominal: Soft. He exhibits no distension. There is no tenderness.  Musculoskeletal: He exhibits no edema or tenderness.  Neurological: He is alert and oriented to person, place, and time.  Skin: Skin is warm and dry. No rash noted. He is not diaphoretic. No erythema.  Psychiatric: He has a normal mood and affect.  Vitals reviewed.    ED Treatments / Results  Labs (all labs ordered are listed, but only abnormal results are displayed) Labs Reviewed  BASIC METABOLIC PANEL - Abnormal; Notable for the following:       Result Value   Glucose, Bld 104 (*)    All other components within normal limits  CBC  I-STAT TROPOININ, ED    EKG  EKG Interpretation  Date/Time:  Sunday Oct 02 2016 22:03:17 EDT Ventricular Rate:  91 PR Interval:  136 QRS Duration: 94 QT Interval:  360 QTC Calculation: 442 R Axis:   -14 Text Interpretation:  Normal sinus rhythm Normal ECG No significant change since last tracing Confirmed by [facility] MD, PEDRO ([id number]) on 10/02/2016 10:34:33 PM       Radiology Dg Chest 2 View  Result Date: 10/02/2016 CLINICAL DATA:  Chest pain EXAM: CHEST  2 VIEW COMPARISON:  Chest radiograph 08/04/2015 FINDINGS: The heart size and mediastinal contours are within normal limits. Both lungs are clear. The visualized skeletal structures are unremarkable. IMPRESSION: No active cardiopulmonary disease. Electronically Signed   By: Deatra Robinson M.D.   On: 10/02/2016 22:28    Procedures Procedures (including critical care time)  Medications Ordered in ED Medications - No data to  display   Initial Impression / Assessment and Plan / ED Course  I have reviewed the triage vital signs and the nursing notes.  Pertinent labs & imaging results that were available during my care of the patient were reviewed by me and considered in my medical decision making (see chart for details).     Presentation most consistent with a chest wall pain. Highly inconsistent with ACS. Triage workup revealed EKG without acute ischemic changes or evidence of pericarditis. Initial troponin negative. Feel this is sufficient to rule out ACS. No need for additional cardiac workup. PERC negative; low suspicion for pulmonary embolism. Presentation I classic for aortic dissection or esophageal perforation. Chest x-ray without evidence suggestive of pneumonia, pneumothorax, pneumomediastinum.  No abnormal contour of the mediastinum to suggest dissection. No evidence of acute injuries.  The patient is safe for discharge with strict return precautions.   Final Clinical Impressions(s) / ED Diagnoses   Final diagnoses:  Chest wall pain  Disposition: Discharge  Condition: Good  I have discussed the results, Dx and Tx plan with the patient who expressed understanding and agree(s) with the plan. Discharge instructions discussed at great length. The patient was given strict return precautions who verbalized understanding of the instructions. No further questions at time of discharge.    New Prescriptions   No medications on file    Follow Up: primary care provider  Schedule an appointment as soon as possible for a visit  As needed      Cardama, Amadeo GarnetPedro Eduardo, MD 10/02/16 2339

## 2016-10-03 NOTE — ED Notes (Signed)
Pt stable, understands discharge instructions, and reasons for return.   

## 2016-10-12 ENCOUNTER — Encounter: Payer: BLUE CROSS/BLUE SHIELD | Admitting: Family Medicine

## 2016-12-12 ENCOUNTER — Encounter: Payer: Medicaid Other | Admitting: Family Medicine

## 2017-02-07 ENCOUNTER — Ambulatory Visit: Payer: Self-pay | Admitting: Internal Medicine

## 2017-02-14 ENCOUNTER — Encounter: Payer: Self-pay | Admitting: Endocrinology

## 2017-02-14 ENCOUNTER — Ambulatory Visit (INDEPENDENT_AMBULATORY_CARE_PROVIDER_SITE_OTHER): Payer: Self-pay | Admitting: Endocrinology

## 2017-02-14 VITALS — BP 108/64 | HR 79 | Wt 248.8 lb

## 2017-02-14 DIAGNOSIS — E039 Hypothyroidism, unspecified: Secondary | ICD-10-CM

## 2017-02-14 LAB — TSH: TSH: 9.37 u[IU]/mL — AB (ref 0.35–4.50)

## 2017-02-14 LAB — T4, FREE: Free T4: 0.76 ng/dL (ref 0.60–1.60)

## 2017-02-14 MED ORDER — LEVOTHYROXINE SODIUM 88 MCG PO TABS: 88.0000 ug | ORAL_TABLET | Freq: Every day | ORAL | 5 refills | Status: DC

## 2017-02-14 NOTE — Progress Notes (Signed)
Subjective:    Patient ID: Robert Black, male    DOB: 05/07/1996, 21 y.o.   MRN: [id number]  HPI Pt returns for f/u of chronic primary hypothyroidism (dx'ed 2014; he has been on thyroid hormone therapy since dx; he has never taken non-prescribed thyroid hormone therapy; he has never had thyroid imaging, surgery, or XRT to the neck; he has never been on amiodarone or lithium.  He misses the synthroid approx once-twice per week.  pt states he feels well in general, except for difficulty with concentration.  Past Medical History:  Diagnosis Date  . Asthma    as a child  . Hypothyroidism   . Thyroid disease     Past Surgical History:  Procedure Laterality Date  . ORIF SCAPHOID FRACTURE Left 06/22/2015   Procedure: OPEN TREATMENT OF LEFT SCAPHOID FRACTURE;  Surgeon: Mack Hook, MD;  Location: [facility];  Service: Orthopedics;  Laterality: Left;  PREOP BLOCK  . TONSILLECTOMY      Social History   Social History  . Marital status: Single    Spouse name: N/A  . Number of children: N/A  . Years of education: N/A   Occupational History  . Not on file.   Social History Main Topics  . Smoking status: Never Smoker  . Smokeless tobacco: Never Used  . Alcohol use Yes  . Drug use: No  . Sexual activity: Yes   Other Topics Concern  . 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 omeprazole (PRILOSEC) 20 MG capsule Take 1 capsule (20 mg total) by mouth daily. (Patient not taking: Reported on 02/14/2017) 30 capsule 0   No current facility-administered medications on file prior to visit.     No Known Allergies  Family History  Problem Relation Age of Onset  . Thyroid disease Mother        hypothyroid  . Obesity Mother   . Thyroid disease Maternal Grandmother   . Hypertension  Maternal Grandmother     BP 108/64   Pulse 79   Wt 248 lb 12.8 oz (112.9 kg)   SpO2 98%   BMI 33.74 kg/m    Review of Systems No weight change.     Objective:   Physical Exam VITAL SIGNS:  See vs page.  GENERAL: no distress. NECK: There is no palpable thyroid enlargement.  No thyroid nodule is palpable.  No palpable lymphadenopathy at the anterior neck.        Assessment & Plan:  Hypothyroidism: due for recheck.   Patient Instructions  blood tests are requested for you today.  We'll let you know about the results.   It is best to never miss the thyroid pill.  However, if you do miss, you can take 2 the next day.  Please return in 1 year.        Hypothyroidism The thyroid is a large gland located in the lower front of your neck. The thyroid gland helps control metabolism. Metabolism is how your body handles food. It controls metabolism with the hormone thyroxine. When this gland is underactive (hypothyroid), it produces too little hormone.  CAUSES These include:   Absence or destruction of thyroid tissue.  Goiter due to iodine deficiency.  Goiter due to medications.  Congenital defects (since birth).  Problems with  the pituitary. This causes a lack of TSH (thyroid stimulating hormone). This hormone tells the thyroid to turn out more hormone. SYMPTOMS  Lethargy (feeling as though you have no energy)  Cold intolerance  Weight gain (in spite of normal food intake)  Dry skin  Coarse hair  Menstrual irregularity (if severe, may lead to infertility)  Slowing of thought processes Cardiac problems are also caused by insufficient amounts of thyroid hormone. Hypothyroidism in the newborn is cretinism, and is an extreme form. It is important that this form be treated adequately and immediately or it will lead rapidly to retarded physical and mental development. DIAGNOSIS  To prove hypothyroidism, your caregiver may do blood tests and ultrasound tests. Sometimes  the signs are hidden. It may be necessary for your caregiver to watch this illness with blood tests either before or after diagnosis and treatment. TREATMENT  Low levels of thyroid hormone are increased by using synthetic thyroid hormone. This is a safe, effective treatment. It usually takes about four weeks to gain the full effects of the medication. After you have the full effect of the medication, it will generally take another four weeks for problems to leave. Your caregiver may start you on low doses. If you have had heart problems the dose may be gradually increased. It is generally not an emergency to get rapidly to normal. HOME CARE INSTRUCTIONS   Take your medications as your caregiver suggests. Let your caregiver know of any medications you are taking or start taking. Your caregiver will help you with dosage schedules.  As your condition improves, your dosage needs may increase. It will be necessary to have continuing blood tests as suggested by your caregiver.  Report all suspected medication side effects to your caregiver. SEEK MEDICAL CARE IF: Seek medical care if you develop:  Sweating.  Tremulousness (tremors).  Anxiety.  Rapid weight loss.  Heat intolerance.  Emotional swings.  Diarrhea.  Weakness. SEEK IMMEDIATE MEDICAL CARE IF:  You develop chest pain, an irregular heart beat (palpitations), or a rapid heart beat. MAKE SURE YOU:   Understand these instructions.  Will watch your condition.  Will get help right away if you are not doing well or get worse. Document Released: 05/02/2005 Document Revised: 07/25/2011 Document Reviewed: 12/21/2007 [facility] Patient Information 2015 [place], [place]. This information is not intended to replace advice given to you by your health care provider. Make sure you discuss any questions you have with your health care provider.

## 2017-02-14 NOTE — Patient Instructions (Addendum)
blood tests are requested for you today.  We'll let you know about the results.   It is best to never miss the thyroid pill.  However, if you do miss, you can take 2 the next day.  Please return in 1 year.        Hypothyroidism The thyroid is a large gland located in the lower front of your neck. The thyroid gland helps control metabolism. Metabolism is how your body handles food. It controls metabolism with the hormone thyroxine. When this gland is underactive (hypothyroid), it produces too little hormone.  CAUSES These include:   Absence or destruction of thyroid tissue.  Goiter due to iodine deficiency.  Goiter due to medications.  Congenital defects (since birth).  Problems with the pituitary. This causes a lack of TSH (thyroid stimulating hormone). This hormone tells the thyroid to turn out more hormone. SYMPTOMS  Lethargy (feeling as though you have no energy)  Cold intolerance  Weight gain (in spite of normal food intake)  Dry skin  Coarse hair  Menstrual irregularity (if severe, may lead to infertility)  Slowing of thought processes Cardiac problems are also caused by insufficient amounts of thyroid hormone. Hypothyroidism in the newborn is cretinism, and is an extreme form. It is important that this form be treated adequately and immediately or it will lead rapidly to retarded physical and mental development. DIAGNOSIS  To prove hypothyroidism, your caregiver may do blood tests and ultrasound tests. Sometimes the signs are hidden. It may be necessary for your caregiver to watch this illness with blood tests either before or after diagnosis and treatment. TREATMENT  Low levels of thyroid hormone are increased by using synthetic thyroid hormone. This is a safe, effective treatment. It usually takes about four weeks to gain the full effects of the medication. After you have the full effect of the medication, it will generally take another four weeks for problems to  leave. Your caregiver may start you on low doses. If you have had heart problems the dose may be gradually increased. It is generally not an emergency to get rapidly to normal. HOME CARE INSTRUCTIONS   Take your medications as your caregiver suggests. Let your caregiver know of any medications you are taking or start taking. Your caregiver will help you with dosage schedules.  As your condition improves, your dosage needs may increase. It will be necessary to have continuing blood tests as suggested by your caregiver.  Report all suspected medication side effects to your caregiver. SEEK MEDICAL CARE IF: Seek medical care if you develop:  Sweating.  Tremulousness (tremors).  Anxiety.  Rapid weight loss.  Heat intolerance.  Emotional swings.  Diarrhea.  Weakness. SEEK IMMEDIATE MEDICAL CARE IF:  You develop chest pain, an irregular heart beat (palpitations), or a rapid heart beat. MAKE SURE YOU:   Understand these instructions.  Will watch your condition.  Will get help right away if you are not doing well or get worse. Document Released: 05/02/2005 Document Revised: 07/25/2011 Document Reviewed: 12/21/2007 [facility] Patient Information 2015 [place], [place]. This information is not intended to replace advice given to you by your health care provider. Make sure you discuss any questions you have with your health care provider.

## 2017-03-19 ENCOUNTER — Other Ambulatory Visit: Payer: Self-pay | Admitting: Endocrinology

## 2017-03-19 DIAGNOSIS — E039 Hypothyroidism, unspecified: Secondary | ICD-10-CM

## 2017-03-20 ENCOUNTER — Other Ambulatory Visit (INDEPENDENT_AMBULATORY_CARE_PROVIDER_SITE_OTHER): Payer: Self-pay

## 2017-03-20 DIAGNOSIS — E039 Hypothyroidism, unspecified: Secondary | ICD-10-CM

## 2017-03-20 LAB — T4, FREE: FREE T4: 0.7 ng/dL (ref 0.60–1.60)

## 2017-03-20 LAB — TSH: TSH: 3.75 u[IU]/mL (ref 0.35–4.50)

## 2017-05-03 ENCOUNTER — Other Ambulatory Visit: Payer: Self-pay

## 2017-05-03 ENCOUNTER — Encounter: Payer: Self-pay | Admitting: Internal Medicine

## 2017-05-03 ENCOUNTER — Ambulatory Visit (INDEPENDENT_AMBULATORY_CARE_PROVIDER_SITE_OTHER): Payer: Self-pay | Admitting: Internal Medicine

## 2017-05-03 DIAGNOSIS — A084 Viral intestinal infection, unspecified: Secondary | ICD-10-CM

## 2017-05-03 NOTE — Progress Notes (Signed)
   [facility] Phone: [phone]  Subjective:  Robert Black is a 21 year old male presenting to clinic with LLQ pain that started 5 days ago. The pain is "sharp" and "crampy". He also notes associated "tingling" over the left side of his abdomen. He had a few episodes of diarrhea, which helped the pain. He has had occasional nausea, but no vomiting. No hematochezia, no hematuria. No dysuria. No fevers. He has also been having some URI symptoms with runny nose and sore throat. His mom is currently sick with diarrhea and vomiting.  ROS: See HPI for pertinent positives and negatives  Past Medical History- hypothyroidism, obesity  Family history reviewed for today's visit. No changes.  Social history- patient is a never smoker. Hx illicit drug use.  Objective: BP 130/82   Pulse 82   Temp 98.4 F (36.9 C) (Oral)   Ht 6' (1.829 m)   Wt 254 lb (115.2 kg)   SpO2 98%   BMI 34.45 kg/m  Gen: NAD, alert, cooperative with exam Back: No CVA tenderness GI: Hyperactive bowel sounds, +mild tenderness to palpation in the LLQ, no rebound, no guarding, abdomen is soft. Skin: No rashes or lesions on the abdomen.  Assessment/Plan: Viral Gastroenteritis: Patient with crampy and sharp LLQ pain. Also with diarrhea and nausea, so think this is likely viral in etiology. Mother also with similar symptoms. No hematuria or left flank pain to suggest nephrolithiasis. No dysuria or CVA tenderness to suggest pyelonephritis (would also be less common in a male). No signs of acute abdomen. - Conservative measures with hydration and Tylenol prn for pain - Return precautions discussed - Follow-up if no improvement in 1-2 weeks   Robert CarolKaty Acea Yagi, MD PGY-3

## 2017-05-03 NOTE — Patient Instructions (Signed)
It was so nice to meet you!  I think you have a viral infection of your colon. This can cause sharp and cramping abdominal pain and diarrhea. I don't think there is anything going on with your kidneys or your back.  You should make sure you are keeping yourself well-hydrated by drinking plenty of water. You can take Tylenol as needed for pain.  If you start having severe abdominal pain, a firm abdomen, or blood in the stool or urine, please come back to see us or go to the emergency department.  -Dr. Nancy MarusMayo

## 2017-05-03 NOTE — Assessment & Plan Note (Signed)
Patient with crampy and sharp LLQ pain. Also with diarrhea and nausea, so think this is likely viral in etiology. Mother also with similar symptoms. No hematuria or left flank pain to suggest nephrolithiasis. No dysuria or CVA tenderness to suggest pyelonephritis (would also be less common in a male). No signs of acute abdomen. - Conservative measures with hydration and Tylenol prn for pain - Return precautions discussed - Follow-up if no improvement in 1-2 weeks

## 2017-05-05 ENCOUNTER — Ambulatory Visit: Payer: Medicaid Other | Admitting: Family Medicine

## 2017-08-13 ENCOUNTER — Other Ambulatory Visit: Payer: Self-pay | Admitting: Endocrinology

## 2017-08-30 ENCOUNTER — Telehealth: Payer: Self-pay

## 2017-08-30 NOTE — Telephone Encounter (Signed)
Pt's mother calling for patient- states pt stepped on a nail and wants to know when his last tetanus shot was. Per [facility] last tetanus was in 2007 so he will need a booster. Advised to check with urgent care- since it is 4pm and we cannot work him in today. Shawna OrleansMeredith B Thomsen, RN

## 2017-09-12 ENCOUNTER — Other Ambulatory Visit: Payer: Self-pay | Admitting: Endocrinology

## 2017-10-14 ENCOUNTER — Other Ambulatory Visit: Payer: Self-pay | Admitting: Endocrinology

## 2017-11-13 ENCOUNTER — Other Ambulatory Visit: Payer: Self-pay | Admitting: Endocrinology

## 2017-12-10 ENCOUNTER — Other Ambulatory Visit: Payer: Self-pay | Admitting: Endocrinology

## 2018-01-07 ENCOUNTER — Other Ambulatory Visit: Payer: Self-pay | Admitting: Endocrinology

## 2018-02-14 ENCOUNTER — Ambulatory Visit: Payer: Medicaid Other | Admitting: Endocrinology

## 2018-02-14 ENCOUNTER — Other Ambulatory Visit: Payer: Self-pay | Admitting: Endocrinology

## 2018-02-14 DIAGNOSIS — Z0289 Encounter for other administrative examinations: Secondary | ICD-10-CM

## 2018-03-12 ENCOUNTER — Other Ambulatory Visit: Payer: Self-pay | Admitting: Endocrinology

## 2018-03-20 ENCOUNTER — Other Ambulatory Visit: Payer: Self-pay | Admitting: Endocrinology

## 2018-03-20 ENCOUNTER — Ambulatory Visit: Payer: Self-pay | Admitting: Endocrinology

## 2018-03-20 ENCOUNTER — Encounter: Payer: Self-pay | Admitting: Endocrinology

## 2018-03-20 VITALS — BP 130/88 | HR 65 | Ht 72.0 in | Wt 251.4 lb

## 2018-03-20 DIAGNOSIS — E039 Hypothyroidism, unspecified: Secondary | ICD-10-CM

## 2018-03-20 LAB — TSH: TSH: 5.52 u[IU]/mL — ABNORMAL HIGH (ref 0.35–4.50)

## 2018-03-20 LAB — T4, FREE: Free T4: 0.76 ng/dL (ref 0.60–1.60)

## 2018-03-20 MED ORDER — LEVOTHYROXINE SODIUM 100 MCG PO TABS: 100.0000 ug | ORAL_TABLET | Freq: Every day | ORAL | 3 refills | Status: DC

## 2018-03-20 NOTE — Progress Notes (Signed)
Subjective:    Patient ID: Robert Black, male    DOB: May 01, 1996, 22 y.o.   MRN: [id number]  HPI Pt returns for f/u of chronic primary hypothyroidism (dx'ed 2014; he has been on thyroid hormone therapy since dx; he has never had thyroid imaging, surgery, or XRT to the neck).  He says he never misses the synthroid.  pt states he feels well in general.   Past Medical History:  Diagnosis Date  . Asthma    as a child  . Hypothyroidism   . Thyroid disease     Past Surgical History:  Procedure Laterality Date  . ORIF SCAPHOID FRACTURE Left 06/22/2015   Procedure: OPEN TREATMENT OF LEFT SCAPHOID FRACTURE;  Surgeon: Mack Hook, MD;  Location: [facility];  Service: Orthopedics;  Laterality: Left;  PREOP BLOCK  . TONSILLECTOMY      Social History   Socioeconomic History  . Marital status: Single    Spouse name: Not on file  . Number of children: Not on file  . Years of education: Not on file  . Highest education level: Not on file  Occupational History  . Not on file  Social Needs  . Financial resource strain: Not on file  . Food insecurity:    Worry: Not on file    Inability: Not on file  . Transportation needs:    Medical: Not on file    Non-medical: Not on file  Tobacco Use  . Smoking status: Never Smoker  . Smokeless tobacco: Never Used  Substance and Sexual Activity  . Alcohol use: Yes  . Drug use: No  . Sexual activity: Yes  Lifestyle  . Physical activity:    Days per week: Not on file    Minutes per session: Not on file  . Stress: Not on file  Relationships  . Social connections:    Talks on phone: Not on file    Gets together: Not on file    Attends religious service: Not on file    Active member of club or organization: Not on file    Attends meetings of clubs or organizations: Not on file    Relationship status: Not on file  . Intimate partner violence:    Fear of current or ex partner: Not on file    Emotionally abused: Not on file      Physically abused: Not on file    Forced sexual activity: Not on file  Other Topics Concern  . Not on file  Social History Narrative   Graduated from high school   Works [employer] representative   Lives with mom, brother, sister, nephew    Current Outpatient Medications on File Prior to Visit  Medication Sig Dispense Refill  . omeprazole (PRILOSEC) 20 MG capsule Take 1 capsule (20 mg total) by mouth daily. 30 capsule 0   No current facility-administered medications on file prior to visit.     No Known Allergies  Family History  Problem Relation Age of Onset  . Thyroid disease Mother        hypothyroid  . Obesity Mother   . Thyroid disease Maternal Grandmother   . Hypertension Maternal Grandmother     BP 130/88 (BP Location: Left Arm, Patient Position: Sitting, Cuff Size: Large)   Pulse 65   Ht 6' (1.829 m)   Wt 251 lb 6.4 oz (114 kg)   SpO2 96%   BMI 34.10 kg/m    Review of Systems No weight change.  Objective:   Physical Exam VITAL SIGNS:  See vs page GENERAL: no distress NECK: There is no palpable thyroid enlargement.  No thyroid nodule is palpable.  No palpable lymphadenopathy at the anterior neck.      Assessment & Plan:  Hypothyroidism: recheck today   Patient Instructions  blood tests are requested for you today.  We'll let you know about the results.   Please return in 1 year.      Hypothyroidism Hypothyroidism is a disorder of the thyroid. The thyroid is a large gland that is located in the lower front of the neck. The thyroid releases hormones that control how the body works. With hypothyroidism, the thyroid does not make enough of these hormones. What are the causes? Causes of hypothyroidism may include:  Viral infections.  Pregnancy.  Your own defense system (immune system) attacking your thyroid.  Certain medicines.  Birth defects.  Past radiation treatments to your head or neck.  Past treatment with radioactive iodine.  Past  surgical removal of part or all of your thyroid.  Problems with the gland that is located in the center of your brain (pituitary).  What are the signs or symptoms? Signs and symptoms of hypothyroidism may include:  Feeling as though you have no energy (lethargy).  Inability to tolerate cold.  Weight gain that is not explained by a change in diet or exercise habits.  Dry skin.  Coarse hair.  Menstrual irregularity.  Slowing of thought processes.  Constipation.  Sadness or depression.  How is this diagnosed? Your health care provider may diagnose hypothyroidism with blood tests and ultrasound tests. How is this treated? Hypothyroidism is treated with medicine that replaces the hormones that your body does not make. After you begin treatment, it may take several weeks for symptoms to go away. Follow these instructions at home:  Take medicines only as directed by your health care provider.  If you start taking any new medicines, tell your health care provider.  Keep all follow-up visits as directed by your health care provider. This is important. As your condition improves, your dosage needs may change. You will need to have blood tests regularly so that your health care provider can watch your condition. Contact a health care provider if:  Your symptoms do not get better with treatment.  You are taking thyroid replacement medicine and: ? You sweat excessively. ? You have tremors. ? You feel anxious. ? You lose weight rapidly. ? You cannot tolerate heat. ? You have emotional swings. ? You have diarrhea. ? You feel weak. Get help right away if:  You develop chest pain.  You develop an irregular heartbeat.  You develop a rapid heartbeat. This information is not intended to replace advice given to you by your health care provider. Make sure you discuss any questions you have with your health care provider. Document Released: 05/02/2005 Document Revised: 10/08/2015  Document Reviewed: 09/17/2013 Elsevier Interactive Patient Education  2018 [employer].

## 2018-03-20 NOTE — Patient Instructions (Addendum)
blood tests are requested for you today.  We'll let you know about the results.   Please return in 1 year.      Hypothyroidism Hypothyroidism is a disorder of the thyroid. The thyroid is a large gland that is located in the lower front of the neck. The thyroid releases hormones that control how the body works. With hypothyroidism, the thyroid does not make enough of these hormones. What are the causes? Causes of hypothyroidism may include:  Viral infections.  Pregnancy.  Your own defense system (immune system) attacking your thyroid.  Certain medicines.  Birth defects.  Past radiation treatments to your head or neck.  Past treatment with radioactive iodine.  Past surgical removal of part or all of your thyroid.  Problems with the gland that is located in the center of your brain (pituitary).  What are the signs or symptoms? Signs and symptoms of hypothyroidism may include:  Feeling as though you have no energy (lethargy).  Inability to tolerate cold.  Weight gain that is not explained by a change in diet or exercise habits.  Dry skin.  Coarse hair.  Menstrual irregularity.  Slowing of thought processes.  Constipation.  Sadness or depression.  How is this diagnosed? Your health care provider may diagnose hypothyroidism with blood tests and ultrasound tests. How is this treated? Hypothyroidism is treated with medicine that replaces the hormones that your body does not make. After you begin treatment, it may take several weeks for symptoms to go away. Follow these instructions at home:  Take medicines only as directed by your health care provider.  If you start taking any new medicines, tell your health care provider.  Keep all follow-up visits as directed by your health care provider. This is important. As your condition improves, your dosage needs may change. You will need to have blood tests regularly so that your health care provider can watch your  condition. Contact a health care provider if:  Your symptoms do not get better with treatment.  You are taking thyroid replacement medicine and: ? You sweat excessively. ? You have tremors. ? You feel anxious. ? You lose weight rapidly. ? You cannot tolerate heat. ? You have emotional swings. ? You have diarrhea. ? You feel weak. Get help right away if:  You develop chest pain.  You develop an irregular heartbeat.  You develop a rapid heartbeat. This information is not intended to replace advice given to you by your health care provider. Make sure you discuss any questions you have with your health care provider. Document Released: 05/02/2005 Document Revised: 10/08/2015 Document Reviewed: 09/17/2013 Elsevier Interactive Patient Education  2018 [employer].

## 2019-02-26 ENCOUNTER — Other Ambulatory Visit: Payer: Self-pay

## 2019-02-26 DIAGNOSIS — E039 Hypothyroidism, unspecified: Secondary | ICD-10-CM

## 2019-02-26 MED ORDER — LEVOTHYROXINE SODIUM 100 MCG PO TABS: 100.0000 ug | ORAL_TABLET | Freq: Every day | ORAL | 0 refills | Status: DC

## 2019-03-19 ENCOUNTER — Other Ambulatory Visit: Payer: Self-pay

## 2019-03-21 ENCOUNTER — Other Ambulatory Visit: Payer: Self-pay

## 2019-03-21 ENCOUNTER — Encounter: Payer: Self-pay | Admitting: Endocrinology

## 2019-03-21 ENCOUNTER — Ambulatory Visit (INDEPENDENT_AMBULATORY_CARE_PROVIDER_SITE_OTHER): Payer: Self-pay | Admitting: Endocrinology

## 2019-03-21 VITALS — BP 126/80 | HR 90 | Ht 72.0 in | Wt 281.8 lb

## 2019-03-21 DIAGNOSIS — E039 Hypothyroidism, unspecified: Secondary | ICD-10-CM

## 2019-03-21 LAB — TSH: TSH: 8.09 u[IU]/mL — ABNORMAL HIGH (ref 0.35–4.50)

## 2019-03-21 MED ORDER — LEVOTHYROXINE SODIUM 125 MCG PO TABS: 125.0000 ug | ORAL_TABLET | Freq: Every day | ORAL | 3 refills | Status: DC

## 2019-03-21 NOTE — Progress Notes (Signed)
Subjective:    Patient ID: Robert Black, male    DOB: 1996-03-16, 23 y.o.   MRN: [id number]  HPI Pt returns for f/u of chronic primary hypothyroidism (dx'ed 2014; he has been on thyroid hormone therapy since dx; he has never had thyroid imaging, surgery, or XRT to the neck).  He says he never misses the synthroid.  pt states he feels well in general.  Past Medical History:  Diagnosis Date  . Asthma    as a child  . Hypothyroidism   . Thyroid disease     Past Surgical History:  Procedure Laterality Date  . ORIF SCAPHOID FRACTURE Left 06/22/2015   Procedure: OPEN TREATMENT OF LEFT SCAPHOID FRACTURE;  Surgeon: Mack Hook, MD;  Location: [facility];  Service: Orthopedics;  Laterality: Left;  PREOP BLOCK  . TONSILLECTOMY      Social History   Socioeconomic History  . Marital status: Single    Spouse name: Not on file  . Number of children: Not on file  . Years of education: Not on file  . Highest education level: Not on file  Occupational History  . Not on file  Social Needs  . Financial resource strain: Not on file  . Food insecurity    Worry: Not on file    Inability: Not on file  . Transportation needs    Medical: Not on file    Non-medical: Not on file  Tobacco Use  . Smoking status: Never Smoker  . Smokeless tobacco: Never Used  Substance and Sexual Activity  . Alcohol use: Yes  . Drug use: No  . Sexual activity: Yes  Lifestyle  . Physical activity    Days per week: Not on file    Minutes per session: Not on file  . Stress: Not on file  Relationships  . Social Musician on phone: Not on file    Gets together: Not on file    Attends religious service: Not on file    Active member of club or organization: Not on file    Attends meetings of clubs or organizations: Not on file    Relationship status: Not on file  . Intimate partner violence    Fear of current or ex partner: Not on file    Emotionally abused: Not on file   Physically abused: Not on file    Forced sexual activity: Not on file  Other Topics Concern  . Not on file  Social History Narrative   Graduated from high school   Works [employer] representative   Lives with mom, brother, sister, nephew    Current Outpatient Medications on File Prior to Visit  Medication Sig Dispense Refill  . omeprazole (PRILOSEC) 20 MG capsule Take 1 capsule (20 mg total) by mouth daily. 30 capsule 0   No current facility-administered medications on file prior to visit.     No Known Allergies  Family History  Problem Relation Age of Onset  . Thyroid disease Mother        hypothyroid  . Obesity Mother   . Thyroid disease Maternal Grandmother   . Hypertension Maternal Grandmother     BP 126/80 (BP Location: Left Arm, Patient Position: Sitting, Cuff Size: Large)   Pulse 90   Ht 6' (1.829 m)   Wt 281 lb 12.8 oz (127.8 kg)   SpO2 99%   BMI 38.22 kg/m    Review of Systems Denies dry skin.      Objective:  Physical Exam VITAL SIGNS:  See vs page GENERAL: no distress NECK: There is no palpable thyroid enlargement.  No thyroid nodule is palpable.  No palpable lymphadenopathy at the anterior neck.     Lab Results  Component Value Date   TSH 8.09 (H) 03/21/2019      Assessment & Plan:  Hypothyroidism: he needs increased rx.  I have sent a prescription to your pharmacy, to increase synthroid

## 2019-03-21 NOTE — Patient Instructions (Signed)
Blood tests are requested for you today.  We'll let you know about the results.  It is best to never miss the medication.  However, if you do miss it, next best is to double up the next time.  Please come back for a follow-up appointment in 1 year.   

## 2019-05-01 ENCOUNTER — Other Ambulatory Visit: Payer: Self-pay | Admitting: Internal Medicine

## 2019-05-01 ENCOUNTER — Ambulatory Visit: Payer: Self-pay | Attending: Internal Medicine

## 2019-05-01 DIAGNOSIS — Z20828 Contact with and (suspected) exposure to other viral communicable diseases: Secondary | ICD-10-CM | POA: Insufficient documentation

## 2019-05-01 DIAGNOSIS — Z20822 Contact with and (suspected) exposure to covid-19: Secondary | ICD-10-CM

## 2019-05-03 LAB — NOVEL CORONAVIRUS, NAA: SARS-CoV-2, NAA: NOT DETECTED

## 2019-05-03 NOTE — Progress Notes (Signed)
Order(s) created erroneously. Erroneous order ID: [id number]  Order moved by: Latiana Tomei M  Order move date/time: 05/03/2019 1:39 PM  Source Patient: [id number]  Source Contact: 05/01/2019  Destination Patient: [id number]  Destination Contact: 05/01/2019 

## 2019-05-03 NOTE — Progress Notes (Signed)
Order(s) created erroneously. Erroneous order ID: [id number]  Order moved by: Brigitte Pulse  Order move date/time: 05/03/2019 1:39 PM  Source Patient: [id number]  Source Contact: 05/01/2019  Destination Patient: [id number]  Destination Contact: 05/01/2019

## 2019-05-03 NOTE — Progress Notes (Signed)
Orders moved to this encounter.  

## 2020-03-09 ENCOUNTER — Encounter (HOSPITAL_COMMUNITY): Payer: Self-pay

## 2020-03-09 ENCOUNTER — Other Ambulatory Visit: Payer: Self-pay

## 2020-03-09 ENCOUNTER — Emergency Department (HOSPITAL_COMMUNITY)
Admission: EM | Admit: 2020-03-09 | Discharge: 2020-03-09 | Disposition: A | Discharge status: Home / Self Care | Payer: No Typology Code available for payment source | Attending: Emergency Medicine | Admitting: Emergency Medicine

## 2020-03-09 DIAGNOSIS — S20312A Abrasion of left front wall of thorax, initial encounter: Secondary | ICD-10-CM | POA: Insufficient documentation

## 2020-03-09 DIAGNOSIS — J45909 Unspecified asthma, uncomplicated: Secondary | ICD-10-CM | POA: Insufficient documentation

## 2020-03-09 DIAGNOSIS — E039 Hypothyroidism, unspecified: Secondary | ICD-10-CM | POA: Insufficient documentation

## 2020-03-09 DIAGNOSIS — S161XXA Strain of muscle, fascia and tendon at neck level, initial encounter: Secondary | ICD-10-CM | POA: Diagnosis not present

## 2020-03-09 DIAGNOSIS — M542 Cervicalgia: Secondary | ICD-10-CM | POA: Diagnosis present

## 2020-03-09 DIAGNOSIS — Z79899 Other long term (current) drug therapy: Secondary | ICD-10-CM | POA: Diagnosis not present

## 2020-03-09 MED ORDER — ACETAMINOPHEN 325 MG PO TABS: 650.0000 mg | ORAL_TABLET | Freq: Four times a day (QID) | ORAL | 0 refills | Status: DC | PRN

## 2020-03-09 MED ORDER — IBUPROFEN 600 MG PO TABS: 600.0000 mg | ORAL_TABLET | Freq: Four times a day (QID) | ORAL | 0 refills | Status: DC | PRN
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
## 2020-03-09 NOTE — Discharge Instructions (Signed)
You will be very sore for the next 7 days.  Then you will gradually begin to recover.  I prescribed you Tylenol and Motrin for pain.  Please drink a lot of water at home.  Please rest for the next 2 days at a minimum.
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
## 2020-03-20 ENCOUNTER — Ambulatory Visit (INDEPENDENT_AMBULATORY_CARE_PROVIDER_SITE_OTHER): Payer: Self-pay | Admitting: Endocrinology

## 2020-03-20 ENCOUNTER — Other Ambulatory Visit: Payer: Self-pay

## 2020-03-20 ENCOUNTER — Encounter: Payer: Self-pay | Admitting: Endocrinology

## 2020-03-20 VITALS — BP 120/80 | HR 85 | Ht 73.0 in | Wt 281.4 lb

## 2020-03-20 DIAGNOSIS — E039 Hypothyroidism, unspecified: Secondary | ICD-10-CM

## 2020-03-20 LAB — TSH: TSH: 2.61 u[IU]/mL (ref 0.35–4.50)

## 2020-03-20 LAB — T4, FREE: Free T4: 0.77 ng/dL (ref 0.60–1.60)

## 2020-03-20 NOTE — Patient Instructions (Signed)
Blood tests are requested for you today.  We'll let you know about the results.  It is best to never miss the medication.  However, if you do miss it, next best is to double up the next time.  Please come back for a follow-up appointment in 1 year.   

## 2020-03-20 NOTE — Progress Notes (Signed)
Subjective:    Patient ID: Robert Black, male    DOB: Jun 07, 1995, 24 y.o.   MRN: [id number]  HPI Pt returns for f/u of chronic primary hypothyroidism (dx'ed 2014; he has been on thyroid hormone therapy since dx; he has never had thyroid imaging, surgery, or XRT to the neck).  He says he rarely misses the synthroid.  pt states he feels well in general.    Past Medical History:  Diagnosis Date  . Asthma    as a child  . Hypothyroidism   . Thyroid disease     Past Surgical History:  Procedure Laterality Date  . ORIF SCAPHOID FRACTURE Left 06/22/2015   Procedure: OPEN TREATMENT OF LEFT SCAPHOID FRACTURE;  Surgeon: Mack Hook, MD;  Location: [facility];  Service: Orthopedics;  Laterality: Left;  PREOP BLOCK  . TONSILLECTOMY      Social History   Socioeconomic History  . Marital status: Single    Spouse name: Not on file  . Number of children: Not on file  . Years of education: Not on file  . Highest education level: Not on file  Occupational History  . Not on file  Tobacco Use  . Smoking status: Never Smoker  . Smokeless tobacco: Never Used  Vaping Use  . Vaping Use: Never used  Substance and Sexual Activity  . Alcohol use: Yes  . Drug use: No  . Sexual activity: Yes  Other Topics Concern  . Not on file  Social History Narrative   Graduated from high school   Works Holiday representative   Lives with mom, brother, sister, nephew   Social Determinants of Health   Financial Resource Strain:   . Difficulty of Paying Living Expenses: Not on file  Food Insecurity:   . Worried About Programme researcher, broadcasting/film/video in the Last Year: Not on file  . Ran Out of Food in the Last Year: Not on file  Transportation Needs:   . Lack of Transportation (Medical): Not on file  . Lack of Transportation (Non-Medical): Not on file  Physical Activity:   . Days of Exercise per Week: Not on file  . Minutes of Exercise per Session: Not on file  Stress:   . Feeling of Stress : Not on  file  Social Connections:   . Frequency of Communication with Friends and Family: Not on file  . Frequency of Social Gatherings with Friends and Family: Not on file  . Attends Religious Services: Not on file  . Active Member of Clubs or Organizations: Not on file  . Attends Banker Meetings: Not on file  . Marital Status: Not on file  Intimate Partner Violence:   . Fear of Current or Ex-Partner: Not on file  . Emotionally Abused: Not on file  . Physically Abused: Not on file  . Sexually Abused: Not on file    Current Outpatient Medications on File Prior to Visit  Medication Sig Dispense Refill  . acetaminophen (TYLENOL) 325 MG tablet Take 2 tablets (650 mg total) by mouth every 6 (six) hours as needed for up to 30 doses for mild pain or moderate pain. 30 tablet 0  . ibuprofen (ADVIL) 600 MG tablet Take 1 tablet (600 mg total) by mouth every 6 (six) hours as needed for up to 30 doses for mild pain or moderate pain. 30 tablet 0  . levothyroxine (SYNTHROID) 125 MCG tablet Take 1 tablet (125 mcg total) by mouth daily. 90 tablet 3   No current  facility-administered medications on file prior to visit.    No Known Allergies  Family History  Problem Relation Age of Onset  . Thyroid disease Mother        hypothyroid  . Obesity Mother   . Thyroid disease Maternal Grandmother   . Hypertension Maternal Grandmother     BP 120/80   Pulse 85   Ht 6\' 1"  (1.854 m)   Wt 281 lb 6.4 oz (127.6 kg)   SpO2 98%   BMI 37.13 kg/m    Review of Systems     Objective:   Physical Exam VITAL SIGNS:  See vs page GENERAL: no distress NECK: There is no palpable thyroid enlargement.  No thyroid nodule is palpable.  No palpable lymphadenopathy at the anterior neck.    Lab Results  Component Value Date   TSH 2.61 03/20/2020      Assessment & Plan:  Hypothyroidism: well-replaced.  Please continue the same synthroid.

## 2020-03-21 ENCOUNTER — Other Ambulatory Visit: Payer: Self-pay | Admitting: Endocrinology

## 2020-03-21 DIAGNOSIS — E039 Hypothyroidism, unspecified: Secondary | ICD-10-CM

## 2020-06-09 ENCOUNTER — Other Ambulatory Visit: Payer: Self-pay

## 2020-06-09 ENCOUNTER — Ambulatory Visit
Admission: RE | Admit: 2020-06-09 | Discharge: 2020-06-09 | Disposition: A | Discharge status: Home / Self Care | Payer: Self-pay | Source: Ambulatory Visit | Attending: Family Medicine | Admitting: Family Medicine

## 2020-06-09 VITALS — BP 119/82 | HR 90 | Temp 98.3°F | Resp 16 | Wt 280.0 lb

## 2020-06-09 DIAGNOSIS — R109 Unspecified abdominal pain: Secondary | ICD-10-CM

## 2020-06-09 DIAGNOSIS — R10A0 Flank pain, unspecified side: Secondary | ICD-10-CM

## 2020-06-09 LAB — POCT URINALYSIS DIP (MANUAL ENTRY)
Bilirubin, UA: NEGATIVE
Blood, UA: NEGATIVE
Glucose, UA: NEGATIVE mg/dL
Ketones, POC UA: NEGATIVE mg/dL
Leukocytes, UA: NEGATIVE
Nitrite, UA: NEGATIVE
Protein Ur, POC: 100 mg/dL — AB
Spec Grav, UA: 1.025 (ref 1.010–1.025)
Urobilinogen, UA: 1 E.U./dL
pH, UA: 6 (ref 5.0–8.0)

## 2020-06-09 NOTE — Discharge Instructions (Addendum)
Your urine was not concerning Drink plenty of water This could be a kidney stone or pulled muscle Ibuprofen for pain, 600 mg every 8 hours or tylenol 1000 mg every 8 hours.  Follow up as needed for continued or worsening symptoms

## 2020-06-09 NOTE — ED Triage Notes (Signed)
Patient presents to Urgent Care with complaints of left lower side pain with tingling x 1-2 weeks. Reports some abdominal discomfort with urine described as a "foamy urine with odor." Pt states he has noted more freq bowel movements with changes to texture. No changes in diet.   Denies fever, dysuria, hematuria.

## 2020-06-09 NOTE — ED Provider Notes (Signed)
Robert Black    CSN: [id number] Arrival date & time: 06/09/20  1239      History   Chief Complaint Chief Complaint  Patient presents with  . Back Pain    HPI Robert Black is a 25 y.o. male.   Patient is a 25 year old male who presents today with good plaints of left lower back, flank pain, tingling for the past week and 1/2 to 2 weeks.  Symptoms have been coming and going.  Some abdominal discomfort with odor to urine.  No dysuria, hematuria urinary frequency.  No fevers, chills, nausea or vomiting.  Reporting some frequent bowel movements but not diarrhea.  No changes in diet.  No concern for STDs at this time.  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 Asthma    as a child  . Hypothyroidism   . Thyroid disease     Patient Active Problem List   Diagnosis Date Noted  . Viral gastroenteritis 05/03/2017  . Abdominal pain 08/12/2015  . Illicit drug use 02/25/2015  . Condyloma acuminata 07/30/2014  . Acute thoracic back pain 07/04/2014  . High risk sexual behavior 03/04/2014  . Back pain without radiculopathy 01/17/2014  . Obesity (BMI >32) 11/26/2013  . Hypothyroidism 06/03/2013  . Abnormal endocrine laboratory test finding 11/28/2012  . Acanthosis 11/28/2012  . Obesity, unspecified 11/28/2012    Past Surgical History:  Procedure Laterality Date  . ORIF SCAPHOID FRACTURE Left 06/22/2015   Procedure: OPEN TREATMENT OF LEFT SCAPHOID FRACTURE;  Surgeon: Robert Hook, MD;  Location: [facility];  Service: Orthopedics;  Laterality: Left;  PREOP BLOCK  . TONSILLECTOMY         Home Medications    Prior to Admission medications   Medication Sig Start Date End Date Taking? Authorizing Provider  acetaminophen (TYLENOL) 325 MG tablet Take 2 tablets (650 mg total) by mouth every 6 (six) hours as needed for up to 30 doses for mild pain or moderate pain.  03/09/20   Terald Sleeper, MD  ibuprofen (ADVIL) 600 MG tablet Take 1 tablet (600 mg total) by mouth every 6 (six) hours as needed for up to 30 doses for mild pain or moderate pain. 03/09/20   Terald Sleeper, MD  levothyroxine (SYNTHROID) 125 MCG tablet TAKE 1 TABLET(125 MCG) BY MOUTH DAILY 03/22/20   Romero Belling, MD    Family History Family History  Problem Relation Age of Onset  . Thyroid disease Mother        hypothyroid  . Obesity Mother   . Thyroid disease Maternal Grandmother   . Hypertension Maternal Grandmother     Social History Social History   Tobacco Use  . Smoking status: Never Smoker  . Smokeless tobacco: Never Used  Vaping Use  . Vaping Use: Never used  Substance Use Topics  . Alcohol use: Yes  . Drug use: No     Allergies   Patient has no known allergies.   Review of Systems Review of Systems   Physical Exam Triage Vital Signs ED Triage Vitals  Enc Vitals Group     BP 06/09/20 1317 119/82     Pulse Rate 06/09/20 1317 90     Resp 06/09/20 1317 16     Temp 06/09/20 1317 98.3 F (36.8 C)     Temp Source 06/09/20 1317 Temporal     SpO2 06/09/20 1317 97 %  Weight 06/09/20 1316 280 lb (127 kg)     Height --      Head Circumference --      Peak Flow --      Pain Score 06/09/20 1316 0     Pain Loc --      Pain Edu? --      Excl. in GC? --    No data found.  Updated Vital Signs BP 119/82 (BP Location: Left Arm)   Pulse 90   Temp 98.3 F (36.8 C) (Temporal)   Resp 16   Wt 280 lb (127 kg)   SpO2 97%   BMI 36.94 kg/m   Visual Acuity Right Eye Distance:   Left Eye Distance:   Bilateral Distance:    Right Eye Near:   Left Eye Near:    Bilateral Near:     Physical Exam Vitals and nursing note reviewed.  Constitutional:      Appearance: Normal appearance.  HENT:     Head: Normocephalic and atraumatic.     Nose: Nose normal.  Eyes:     Conjunctiva/sclera: Conjunctivae normal.  Pulmonary:     Effort: Pulmonary effort is  normal.  Musculoskeletal:        General: Normal range of motion.     Cervical back: Normal range of motion.       Back:     Comments: Area where pain is located No rash. No swelling.   Skin:    General: Skin is warm and dry.  Neurological:     Mental Status: He is alert.  Psychiatric:        Mood and Affect: Mood normal.      UC Treatments / Results  Labs (all labs ordered are listed, but only abnormal results are displayed) Labs Reviewed  POCT URINALYSIS DIP (MANUAL ENTRY) - Abnormal; Notable for the following components:      Result Value   Protein Ur, POC =100 (*)    All other components within normal limits    EKG   Radiology No results found.  Procedures Procedures (including critical care time)  Medications Ordered in UC Medications - No data to display  Initial Impression / Assessment and Plan / UC Course  I have reviewed the triage vital signs and the nursing notes.  Pertinent labs & imaging results that were available during my care of the patient were reviewed by me and considered in my medical decision making (see chart for details).     Flank pain No CVA tenderness on exam Pt with protein in urine.  No concerns for infection No other concerning red flags  Could be pulled muscle vs kidney stone  Recommended return for any issues that continue  Final Clinical Impressions(s) / UC Diagnoses   Final diagnoses:  Flank pain     Discharge Instructions     Your urine was not concerning Drink plenty of water This could be a kidney stone or pulled muscle Ibuprofen for pain, 600 mg every 8 hours or tylenol 1000 mg every 8 hours.  Follow up as needed for continued or worsening symptoms     ED Prescriptions    None     PDMP not reviewed this encounter.   Janace Aris, NP 06/09/20 1421

## 2020-06-27 ENCOUNTER — Ambulatory Visit: Payer: Self-pay

## 2020-06-27 ENCOUNTER — Emergency Department: Payer: Self-pay

## 2020-06-27 ENCOUNTER — Other Ambulatory Visit: Payer: Self-pay

## 2020-06-27 ENCOUNTER — Emergency Department
Admission: EM | Admit: 2020-06-27 | Discharge: 2020-06-27 | Disposition: A | Discharge status: Home / Self Care | Payer: Self-pay | Attending: Emergency Medicine | Admitting: Emergency Medicine

## 2020-06-27 DIAGNOSIS — R1030 Lower abdominal pain, unspecified: Secondary | ICD-10-CM

## 2020-06-27 DIAGNOSIS — E039 Hypothyroidism, unspecified: Secondary | ICD-10-CM | POA: Insufficient documentation

## 2020-06-27 DIAGNOSIS — J45909 Unspecified asthma, uncomplicated: Secondary | ICD-10-CM | POA: Insufficient documentation

## 2020-06-27 DIAGNOSIS — M545 Low back pain, unspecified: Secondary | ICD-10-CM | POA: Insufficient documentation

## 2020-06-27 DIAGNOSIS — R160 Hepatomegaly, not elsewhere classified: Secondary | ICD-10-CM

## 2020-06-27 DIAGNOSIS — Z79899 Other long term (current) drug therapy: Secondary | ICD-10-CM | POA: Insufficient documentation

## 2020-06-27 LAB — URINALYSIS, COMPLETE (UACMP) WITH MICROSCOPIC
Bacteria, UA: NONE SEEN
Bilirubin Urine: NEGATIVE
Glucose, UA: NEGATIVE mg/dL
Hgb urine dipstick: NEGATIVE
Ketones, ur: NEGATIVE mg/dL
Leukocytes,Ua: NEGATIVE
Nitrite: NEGATIVE
Protein, ur: NEGATIVE mg/dL
Specific Gravity, Urine: 1.019 (ref 1.005–1.030)
pH: 5 (ref 5.0–8.0)

## 2020-06-27 LAB — COMPREHENSIVE METABOLIC PANEL
ALT: 64 U/L — ABNORMAL HIGH (ref 0–44)
AST: 46 U/L — ABNORMAL HIGH (ref 15–41)
Albumin: 4.4 g/dL (ref 3.5–5.0)
Alkaline Phosphatase: 49 U/L (ref 38–126)
Anion gap: 9 (ref 5–15)
BUN: 16 mg/dL (ref 6–20)
CO2: 22 mmol/L (ref 22–32)
Calcium: 9 mg/dL (ref 8.9–10.3)
Chloride: 107 mmol/L (ref 98–111)
Creatinine, Ser: 0.87 mg/dL (ref 0.61–1.24)
GFR, Estimated: >60 mL/min (ref >60)
Glucose, Bld: 115 mg/dL — ABNORMAL HIGH (ref 70–99)
Potassium: 3.3 mmol/L — ABNORMAL LOW (ref 3.5–5.1)
Sodium: 138 mmol/L (ref 135–145)
Total Bilirubin: 0.8 mg/dL (ref 0.3–1.2)
Total Protein: 7.8 g/dL (ref 6.5–8.1)

## 2020-06-27 LAB — CBC
HCT: 39.9 % (ref 39.0–52.0)
Hemoglobin: 14.4 g/dL (ref 13.0–17.0)
MCH: 31 pg (ref 26.0–34.0)
MCHC: 36.1 g/dL — ABNORMAL HIGH (ref 30.0–36.0)
MCV: 85.8 fL (ref 80.0–100.0)
Platelets: 252 10*3/uL (ref 150–400)
RBC: 4.65 MIL/uL (ref 4.22–5.81)
RDW: 11.9 % (ref 11.5–15.5)
WBC: 6.2 10*3/uL (ref 4.0–10.5)
nRBC: 0 % (ref 0.0–0.2)

## 2020-06-27 LAB — LIPASE, BLOOD: Lipase: 32 U/L (ref 11–51)

## 2020-06-27 MED ORDER — ACETAMINOPHEN 500 MG PO TABS
1000.0000 mg | ORAL_TABLET | Freq: Once | ORAL | Status: AC
  Administered 2020-06-27: 1000 mg via ORAL
  Filled 2020-06-27: qty 2

## 2020-06-27 MED ORDER — POTASSIUM CHLORIDE CRYS ER 20 MEQ PO TBCR
80.0000 meq | EXTENDED_RELEASE_TABLET | Freq: Once | ORAL | Status: AC
  Administered 2020-06-27: 80 meq via ORAL
  Filled 2020-06-27: qty 4

## 2020-06-27 MED ORDER — NAPROXEN 500 MG PO TABS
500.0000 mg | ORAL_TABLET | Freq: Once | ORAL | Status: AC
  Administered 2020-06-27: 500 mg via ORAL
  Filled 2020-06-27: qty 1

## 2020-06-27 NOTE — ED Provider Notes (Signed)
[facility] Emergency Department Provider Note  ____________________________________________   Event Date/Time   First MD Initiated Contact with Patient 06/27/20 0143     (approximate)  I have reviewed the triage vital signs and the nursing notes.   HISTORY  Chief Complaint Abdominal Pain   HPI Robert Black is a 25 y.o. male with a past medical history of asthma, hypothyroidism and obesity who presents accompanied by his girlfriend for assessment of some left lower back pain, left flank pain and left lower abdominal pain has been on and off for the last 3 weeks.  No clear alleviating aggravating or precipitating factors.  Patient denies any headache, earache, sore throat, chest pain, cough, shortness of breath, nausea, vomiting, diarrhea, burning with urination, blood in his stool, blood in his urine, rash or other extremity pain.  He denies any recent injuries or falls although does note he does a lot of heavy lifting at work.  He has no history of kidney stones.  He does not think he is constipated although thinks is stool which otherwise appears normal has smelled a little differently over the last couple days.  No other acute concerns at this time.  He denies significant NSAID or EtOH use         Past Medical History:  Diagnosis Date  . Asthma    as a child  . Hypothyroidism   . Thyroid disease     Patient Active Problem List   Diagnosis Date Noted  . Viral gastroenteritis 05/03/2017  . Abdominal pain 08/12/2015  . Illicit drug use 02/25/2015  . Condyloma acuminata 07/30/2014  . Acute thoracic back pain 07/04/2014  . High risk sexual behavior 03/04/2014  . Back pain without radiculopathy 01/17/2014  . Obesity (BMI >32) 11/26/2013  . Hypothyroidism 06/03/2013  . Abnormal endocrine laboratory test finding 11/28/2012  . Acanthosis 11/28/2012  . Obesity, unspecified 11/28/2012    Past Surgical History:  Procedure Laterality Date  . ORIF  SCAPHOID FRACTURE Left 06/22/2015   Procedure: OPEN TREATMENT OF LEFT SCAPHOID FRACTURE;  Surgeon: Mack Hook, MD;  Location: [facility];  Service: Orthopedics;  Laterality: Left;  PREOP BLOCK  . TONSILLECTOMY      Prior to Admission medications   Medication Sig Start Date End Date Taking? Authorizing Provider  acetaminophen (TYLENOL) 325 MG tablet Take 2 tablets (650 mg total) by mouth every 6 (six) hours as needed for up to 30 doses for mild pain or moderate pain. 03/09/20   Terald Sleeper, MD  ibuprofen (ADVIL) 600 MG tablet Take 1 tablet (600 mg total) by mouth every 6 (six) hours as needed for up to 30 doses for mild pain or moderate pain. 03/09/20   Terald Sleeper, MD  levothyroxine (SYNTHROID) 125 MCG tablet TAKE 1 TABLET(125 MCG) BY MOUTH DAILY 03/22/20   Romero Belling, MD    Allergies Patient has no known allergies.  Family History  Problem Relation Age of Onset  . Thyroid disease Mother        hypothyroid  . Obesity Mother   . Thyroid disease Maternal Grandmother   . Hypertension Maternal Grandmother     Social History Social History   Tobacco Use  . Smoking status: Never Smoker  . Smokeless tobacco: Never Used  Vaping Use  . Vaping Use: Never used  Substance Use Topics  . Alcohol use: Yes  . Drug use: No    Review of Systems  Review of Systems  Constitutional: Negative for  chills and fever.  HENT: Negative for sore throat.   Eyes: Negative for pain.  Respiratory: Negative for cough and stridor.   Cardiovascular: Negative for chest pain.  Gastrointestinal: Positive for abdominal pain. Negative for vomiting.  Genitourinary: Positive for flank pain ( Left).  Musculoskeletal: Positive for back pain ( Left lower).  Skin: Negative for rash.  Neurological: Negative for seizures, loss of consciousness and headaches.  Psychiatric/Behavioral: Negative for suicidal ideas.  All other systems reviewed and are negative.      ____________________________________________   PHYSICAL EXAM:  VITAL SIGNS: ED Triage Vitals  Enc Vitals Group     BP 06/27/20 0038 134/82     Pulse Rate 06/27/20 0038 80     Resp 06/27/20 0038 16     Temp 06/27/20 0038 98.3 F (36.8 C)     Temp Source 06/27/20 0038 Oral     SpO2 06/27/20 0038 100 %     Weight 06/27/20 0039 280 lb (127 kg)     Height 06/27/20 0039 6' (1.829 m)     Head Circumference --      Peak Flow --      Pain Score 06/27/20 0047 7     Pain Loc --      Pain Edu? --      Excl. in GC? --    Vitals:   06/27/20 0038 06/27/20 0207  BP: 134/82 132/82  Pulse: 80 75  Resp: 16 18  Temp: 98.3 F (36.8 C)   SpO2: 100% 99%   Physical Exam Vitals and nursing note reviewed.  Constitutional:      Appearance: He is well-developed and well-nourished.  HENT:     Head: Normocephalic and atraumatic.  Eyes:     Conjunctiva/sclera: Conjunctivae normal.  Cardiovascular:     Rate and Rhythm: Normal rate and regular rhythm.     Heart sounds: No murmur heard.   Pulmonary:     Effort: Pulmonary effort is normal. No respiratory distress.     Breath sounds: Normal breath sounds.  Abdominal:     Palpations: Abdomen is soft.     Tenderness: There is no abdominal tenderness. There is no right CVA tenderness or left CVA tenderness.     Hernia: There is no hernia in the left inguinal area or right inguinal area.  Genitourinary:    Testes:        Right: Mass, tenderness or swelling not present. Right testis is descended.        Left: Mass, tenderness or swelling not present. Left testis is descended.  Musculoskeletal:        General: No edema.     Cervical back: Neck supple.  Skin:    General: Skin is warm and dry.     Capillary Refill: Capillary refill takes less than 2 seconds.  Neurological:     General: No focal deficit present.     Mental Status: He is alert.  Psychiatric:        Mood and Affect: Mood and affect and mood normal.       ____________________________________________   LABS (all labs ordered are listed, but only abnormal results are displayed)  Labs Reviewed  COMPREHENSIVE METABOLIC PANEL - Abnormal; Notable for the following components:      Result Value   Potassium 3.3 (*)    Glucose, Bld 115 (*)    AST 46 (*)    ALT 64 (*)    All other components within normal limits  CBC - Abnormal;  Notable for the following components:   MCHC 36.1 (*)    All other components within normal limits  URINALYSIS, COMPLETE (UACMP) WITH MICROSCOPIC - Abnormal; Notable for the following components:   Color, Urine YELLOW (*)    APPearance CLEAR (*)    All other components within normal limits  LIPASE, BLOOD   ____________________________________________  EKG  ____________________________________________  RADIOLOGY  ED MD interpretation: No evidence of kidney stone or any other clear acute intra-abdominal pelvic pathology.  There is some hepatic steatosis hepatomegaly.  Official radiology report(s): CT Renal Stone Study  Result Date: 06/27/2020 CLINICAL DATA:  Left flank pain for weeks. EXAM: CT ABDOMEN AND PELVIS WITHOUT CONTRAST TECHNIQUE: Multidetector CT imaging of the abdomen and pelvis was performed following the standard protocol without IV contrast. COMPARISON:  None. FINDINGS: Lower chest: The lung bases are clear. Hepatobiliary: Diffusely decreased hepatic density consistent with steatosis. Areas of focal fatty sparing adjacent to the gallbladder fossa. Enlarged liver spanning 23 cm cranial caudal. Gallbladder physiologically distended, no calcified stone. No biliary dilatation. Pancreas: No ductal dilatation or inflammation. Spleen: Upper normal spanning 12.9 cm cranial caudal. Adrenals/Urinary Tract: Normal adrenal glands. No hydronephrosis or perinephric edema. No renal calculi. Both ureters are decompressed without stones along the course. The urinary bladder is partially distended. No bladder stone. No  significant wall thickening for the degree of bladder distension. Stomach/Bowel: Decompressed stomach. Normal positioning of the duodenum and ligament of Treitz. Scattered high-density contents within the bowel lumen consistent with ingested material. Normal appendix. Small to moderate colonic stool burden. No colonic wall thickening or pericolonic edema. No significant diverticular disease. Vascular/Lymphatic: Normal caliber abdominal aorta. No enlarged lymph nodes in the abdomen or pelvis. Scattered retroperitoneal and mesenteric nodes are not enlarged by size criteria. Reproductive: Prostate is unremarkable. Other: No free air, free fluid, or intra-abdominal fluid collection. No body wall hernia. Musculoskeletal: There are no acute or suspicious osseous abnormalities. No musculoskeletal findings to explain flank pain. IMPRESSION: 1. No renal stones or obstructive uropathy. No acute abnormality in the abdomen/pelvis. 2. Hepatic steatosis and hepatomegaly. Electronically Signed   By: Narda Rutherford M.D.   On: 06/27/2020 02:34    ____________________________________________   PROCEDURES  Procedure(s) performed (including Critical Care):  Procedures   ____________________________________________   INITIAL IMPRESSION / ASSESSMENT AND PLAN / ED COURSE      Patient presents with above to history exam for assessment of some neck tingling and discomfort in his left lower back left flank left lower quadrant of the abdomen over the last 3 weeks.  On arrival he is afebrile and hemodynamically stable.  Primary differential includes but is not limited to kidney stone, pyelonephritis, possible diverticulitis, and MSK.  No evidence on exam of hernia including inguinal hernia or evidence of torsion.  CMP remarkable for K of 3.3 and AST of 46 and ALT of 64.  No other acute electrolyte or metabolic derangements.  No evidence of cholestasis.  Lipase of 32 not consistent with acute pancreatitis.  CBC without  leukocytosis or acute anemia.  No historical or exam findings or CT findings suggest acute appendicitis, diverticulitis, pancreatitis or any other clear acute intra-abdominal pelvic pathology.  No evidence on CT of stone perinephric stranding.  In addition UA does not appear infected.  Given stable vitals with a reassuring exam and work-up and duration of symptoms I believe patient safe for discharge with plan for continued outpatient evaluation.  Discharged stable condition patient return cautions advised discussed.  ____________________________________________   FINAL CLINICAL IMPRESSION(S) / ED DIAGNOSES  Final diagnoses:  Lower abdominal pain  Hepatomegaly    Medications  potassium chloride SA (KLOR-CON) CR tablet 80 mEq (has no administration in time range)  acetaminophen (TYLENOL) tablet 1,000 mg (has no administration in time range)  naproxen (NAPROSYN) tablet 500 mg (has no administration in time range)     ED Discharge Orders    None       Note:  This document was prepared using Dragon voice recognition software and may include unintentional dictation errors.   Gilles Chiquito, MD 06/27/20 [phone]

## 2020-06-27 NOTE — Discharge Instructions (Addendum)
Your CT today showed IMPRESSION: 1. No renal stones or obstructive uropathy. No acute abnormality in the abdomen/pelvis. 2. Hepatic steatosis and hepatomegaly.

## 2020-06-27 NOTE — ED Notes (Signed)
Patient in stretcher sitting on edge. Patient states pain is 6/10 but "tolerable". Plan of care discussed with patient, patient states understanding and questions answered.

## 2020-06-27 NOTE — ED Triage Notes (Signed)
Patient reports left lower abdominal pain for couple weeks.

## 2021-03-26 ENCOUNTER — Ambulatory Visit: Payer: Self-pay | Admitting: Endocrinology

## 2021-03-27 ENCOUNTER — Other Ambulatory Visit: Payer: Self-pay | Admitting: Endocrinology

## 2021-03-27 DIAGNOSIS — E039 Hypothyroidism, unspecified: Secondary | ICD-10-CM

## 2021-03-29 ENCOUNTER — Ambulatory Visit (INDEPENDENT_AMBULATORY_CARE_PROVIDER_SITE_OTHER): Payer: Self-pay | Admitting: Endocrinology

## 2021-03-29 ENCOUNTER — Other Ambulatory Visit: Payer: Self-pay

## 2021-03-29 VITALS — BP 100/60 | HR 77 | Ht 72.0 in | Wt 281.0 lb

## 2021-03-29 DIAGNOSIS — E039 Hypothyroidism, unspecified: Secondary | ICD-10-CM

## 2021-03-29 MED ORDER — LEVOTHYROXINE SODIUM 125 MCG PO TABS
ORAL_TABLET | ORAL | 3 refills | Status: DC
Start: 2021-03-29 — End: 2022-05-17

## 2021-03-29 NOTE — Patient Instructions (Addendum)
I have sent a prescription to your pharmacy, to refill the levothyroxine.  Blood tests are requested for you today.  Please do in approx 1 month.  We'll let you know about the results.   It is best to never miss the medication.  However, if you do miss it, next best is to double up the next time.   Please come back for a follow-up appointment in 1 year.

## 2021-03-29 NOTE — Progress Notes (Signed)
Subjective:    Patient ID: Robert Black, male    DOB: July 07, 1995, 25 y.o.   MRN: [id number]  HPI Pt returns for f/u of chronic primary hypothyroidism (dx'ed 2014; he has been on thyroid hormone therapy since dx; he has never had thyroid imaging, surgery, or XRT to the neck).  He says he rarely misses the synthroid, except he has not taken in the past week.  pt states he feels well in general.   Past Medical History:  Diagnosis Date   Asthma    as a child   Hypothyroidism    Thyroid disease     Past Surgical History:  Procedure Laterality Date   ORIF SCAPHOID FRACTURE Left 06/22/2015   Procedure: OPEN TREATMENT OF LEFT SCAPHOID FRACTURE;  Surgeon: Mack Hook, MD;  Location: [facility];  Service: Orthopedics;  Laterality: Left;  PREOP BLOCK   TONSILLECTOMY      Social History   Socioeconomic History   Marital status: Single    Spouse name: Not on file   Number of children: Not on file   Years of education: Not on file   Highest education level: Not on file  Occupational History   Not on file  Tobacco Use   Smoking status: Never   Smokeless tobacco: Never  Vaping Use   Vaping Use: Never used  Substance and Sexual Activity   Alcohol use: Yes   Drug use: No   Sexual activity: Yes  Other Topics Concern   Not on file  Social History Narrative   Graduated from high school   Works Holiday representative   Lives with mom, brother, sister, nephew   Social Determinants of Health   Financial Resource Strain: Not on file  Food Insecurity: Not on file  Transportation Needs: Not on file  Physical Activity: Not on file  Stress: Not on file  Social Connections: Not on file  Intimate Partner Violence: Not on file    Current Outpatient Medications on File Prior to Visit  Medication Sig Dispense Refill   acetaminophen (TYLENOL) 325 MG tablet Take 2 tablets (650 mg total) by mouth every 6 (six) hours as needed for up to 30 doses for mild pain or moderate pain. 30  tablet 0   ibuprofen (ADVIL) 600 MG tablet Take 1 tablet (600 mg total) by mouth every 6 (six) hours as needed for up to 30 doses for mild pain or moderate pain. 30 tablet 0   levothyroxine (SYNTHROID) 125 MCG tablet TAKE 1 TABLET(125 MCG) BY MOUTH DAILY 90 tablet 3   No current facility-administered medications on file prior to visit.    No Known Allergies  Family History  Problem Relation Age of Onset   Thyroid disease Mother        hypothyroid   Obesity Mother    Thyroid disease Maternal Grandmother    Hypertension Maternal Grandmother     BP 100/60 (BP Location: Right Arm, Patient Position: Sitting, Cuff Size: Large)   Pulse 77   Ht 6' (1.829 m)   Wt 281 lb (127.5 kg)   SpO2 98%   BMI 38.11 kg/m    Review of Systems     Objective:   Physical Exam VITAL SIGNS:  See vs page GENERAL: no distress NECK: There is no palpable thyroid enlargement.  No thyroid nodule is palpable.  No palpable lymphadenopathy at the anterior neck.        Assessment & Plan:  Hypothyroidism: therapy limited by noncompliance.    Patient  Instructions  I have sent a prescription to your pharmacy, to refill the levothyroxine.  Blood tests are requested for you today.  Please do in approx 1 month.  We'll let you know about the results.   It is best to never miss the medication.  However, if you do miss it, next best is to double up the next time.   Please come back for a follow-up appointment in 1 year.

## 2021-05-11 IMAGING — CT CT RENAL STONE PROTOCOL
2 of 4 series · 16 of 46 positions shown, 18 images · non-contrast
Comparison: None.

CLINICAL DATA: Left flank pain for weeks.

EXAM:
CT ABDOMEN AND PELVIS WITHOUT CONTRAST
TECHNIQUE: Multidetector CT imaging of the abdomen and pelvis was performed
following the standard protocol without IV contrast.

[Series 2: stone full standard · axial · 0.82mm/px · z∈[-1127,-607]mm · 13 of 114 slices shown, 15 images]
[im 5/114  soft-tissue]
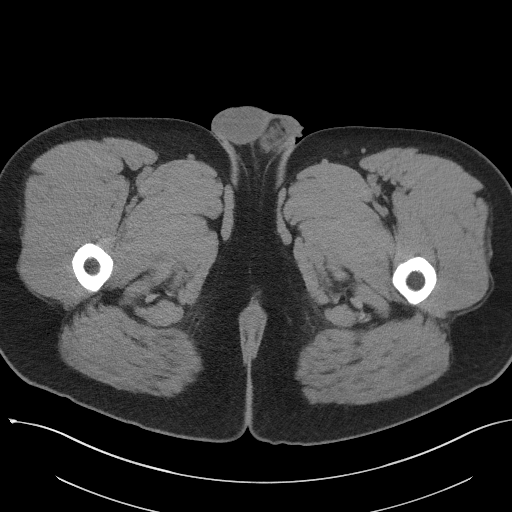
[im 5/114  bone]
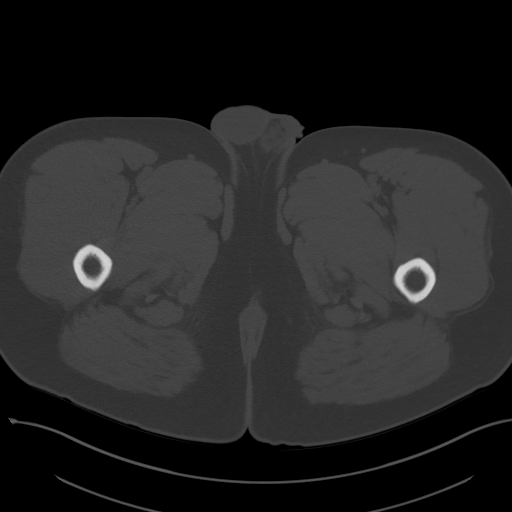
[im 14/114  soft-tissue]
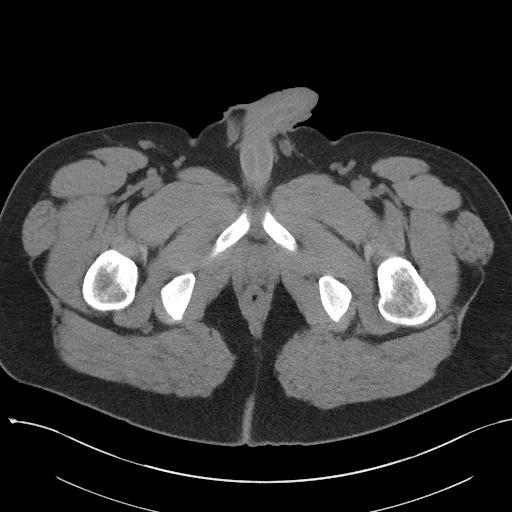
[im 23/114  soft-tissue]
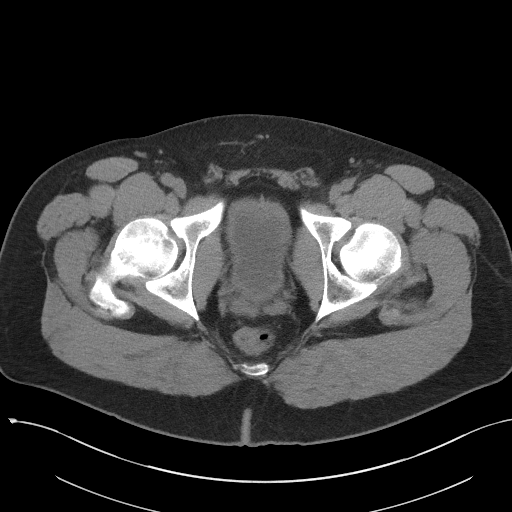
[im 32/114  soft-tissue]
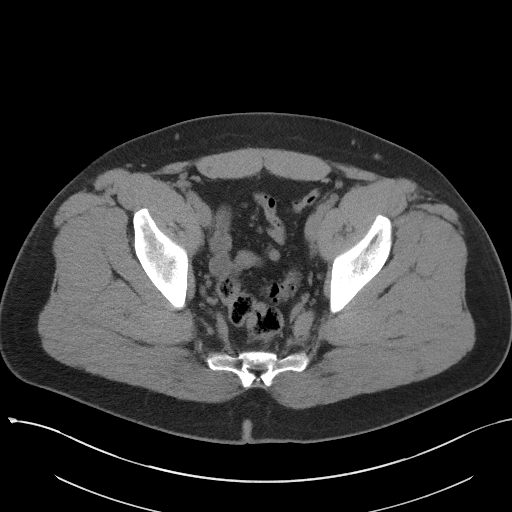
[im 41/114  soft-tissue]
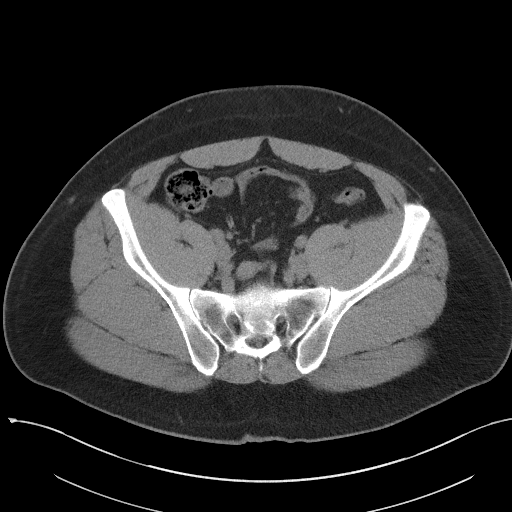
[im 50/114  soft-tissue]
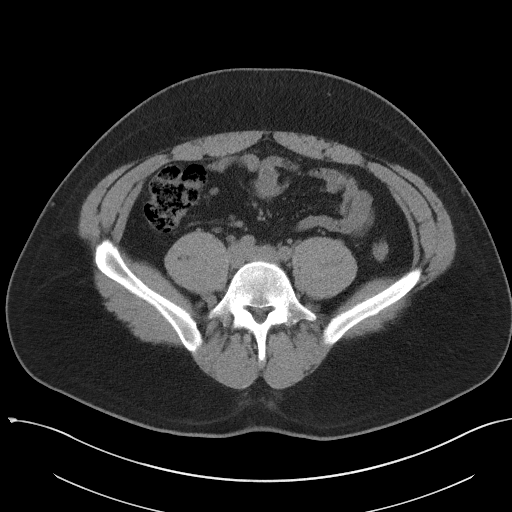
[im 59/114  soft-tissue]
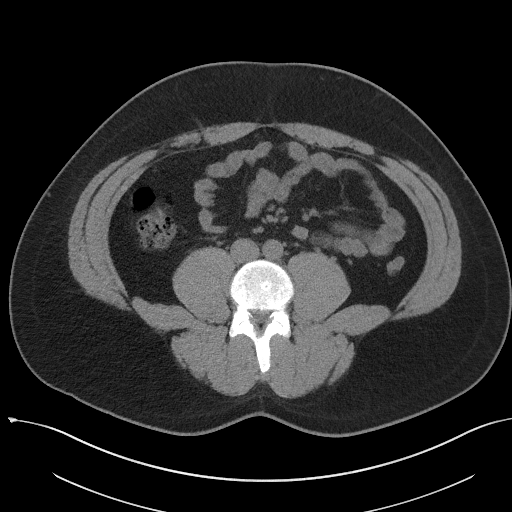
[im 64/114  soft-tissue]
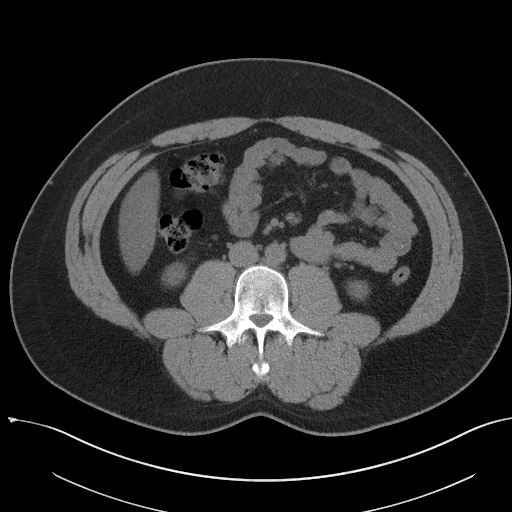
[im 73/114  soft-tissue]
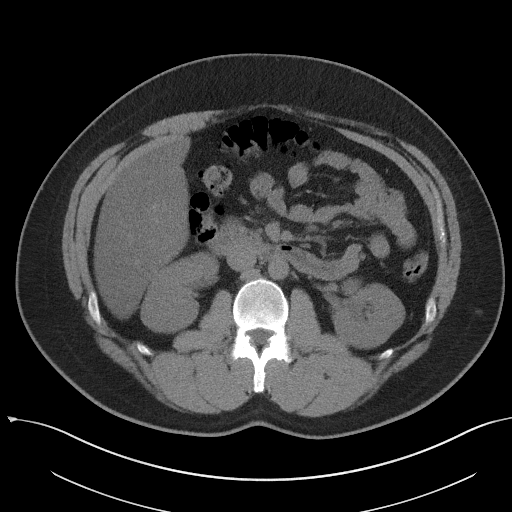
[im 73/114  bone]
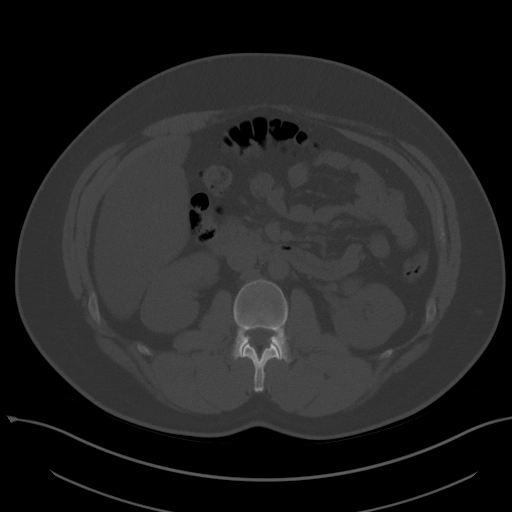
[im 82/114  soft-tissue]
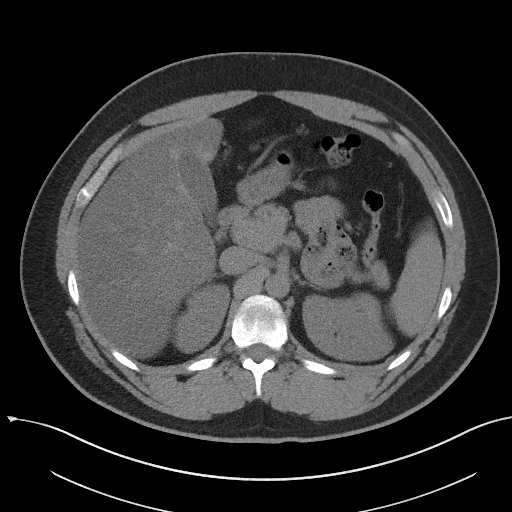
[im 91/114  soft-tissue]
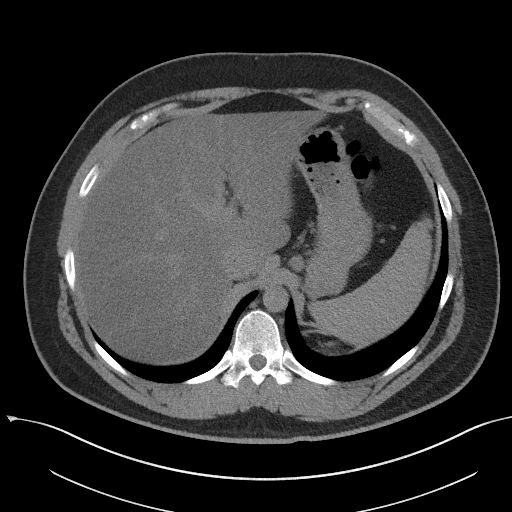
[im 100/114  soft-tissue]
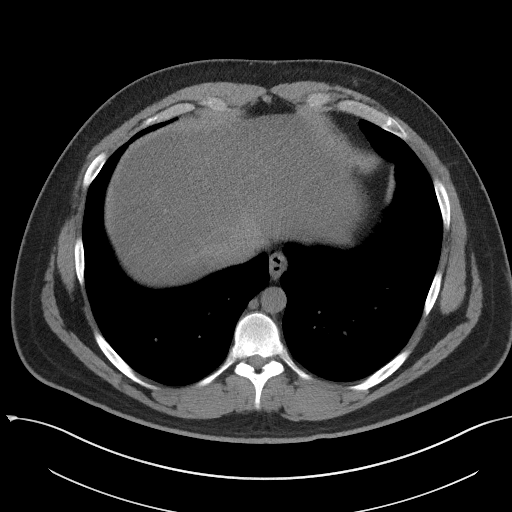
[im 109/114  soft-tissue]
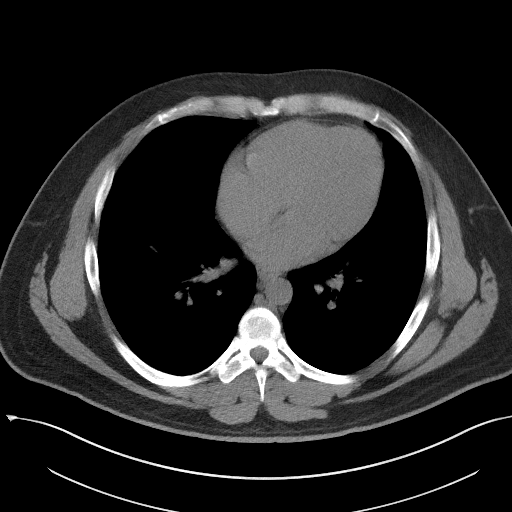

[Series 5: coronal · coronal · 0.91mm/px · 3 of 164 slices shown]
[im 55/164  soft-tissue]
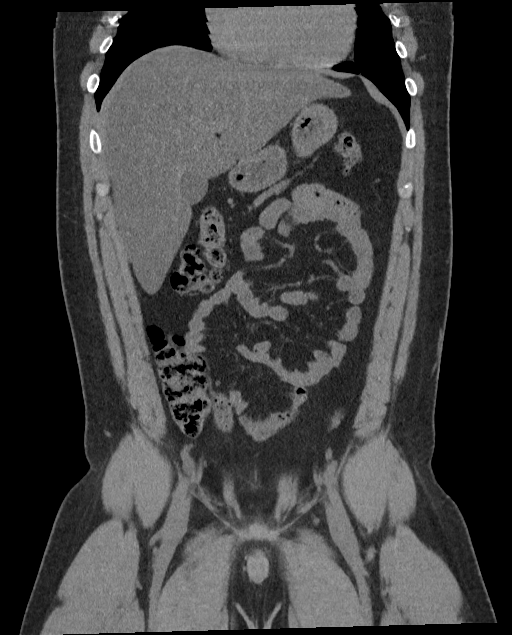
[im 73/164  soft-tissue]
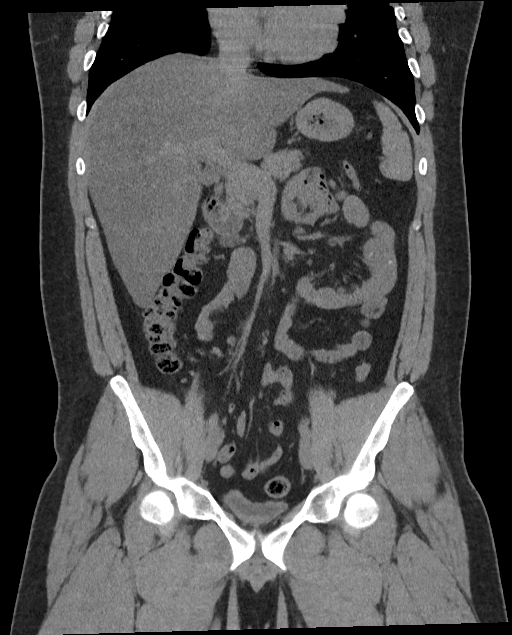
[im 91/164  soft-tissue]
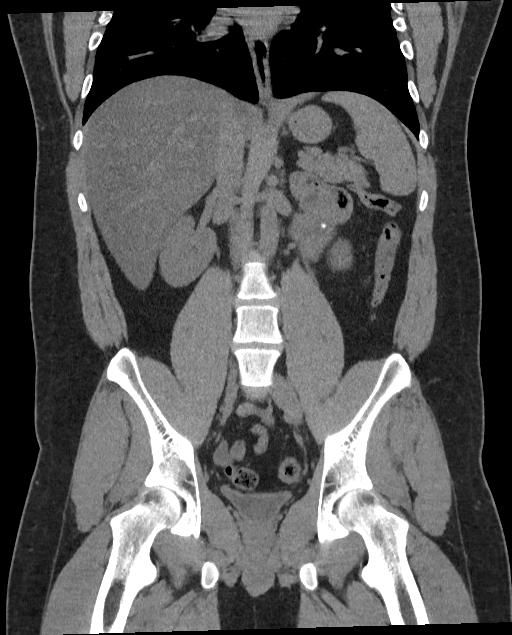

[16 of 46 positions shown; findings below may reference images not displayed]

FINDINGS: Lower chest: The lung bases are clear.

Hepatobiliary: Diffusely decreased hepatic density consistent with
steatosis. Areas of focal fatty sparing adjacent to the gallbladder
fossa. Enlarged liver spanning 23 cm cranial caudal. Gallbladder
physiologically distended, no calcified stone. No biliary
dilatation.

Pancreas: No ductal dilatation or inflammation.

Spleen: Upper normal spanning 12.9 cm cranial caudal.

Adrenals/Urinary Tract: Normal adrenal glands. No hydronephrosis or
perinephric edema. No renal calculi. Both ureters are decompressed
without stones along the course. The urinary bladder is partially
distended. No bladder stone. No significant wall thickening for the
degree of bladder distension.

Stomach/Bowel: Decompressed stomach. Normal positioning of the
duodenum and ligament of Treitz. Scattered high-density contents
within the bowel lumen consistent with ingested material. Normal
appendix. Small to moderate colonic stool burden. No colonic wall
thickening or pericolonic edema. No significant diverticular
disease.

Vascular/Lymphatic: Normal caliber abdominal aorta. No enlarged
lymph nodes in the abdomen or pelvis. Scattered retroperitoneal and
mesenteric nodes are not enlarged by size criteria.

Reproductive: Prostate is unremarkable.

Other: No free air, free fluid, or intra-abdominal fluid collection.
No body wall hernia.

Musculoskeletal: There are no acute or suspicious osseous
abnormalities. No musculoskeletal findings to explain flank pain.
IMPRESSION: 1. No renal stones or obstructive uropathy. No acute abnormality in
the abdomen/pelvis.
2. Hepatic steatosis and hepatomegaly.

## 2021-09-09 ENCOUNTER — Ambulatory Visit: Payer: Self-pay | Admitting: Endocrinology

## 2022-03-29 ENCOUNTER — Ambulatory Visit: Payer: Self-pay | Admitting: Endocrinology

## 2022-04-11 ENCOUNTER — Other Ambulatory Visit: Payer: Self-pay

## 2022-04-11 DIAGNOSIS — E039 Hypothyroidism, unspecified: Secondary | ICD-10-CM

## 2022-04-11 MED ORDER — LEVOTHYROXINE SODIUM 125 MCG PO TABS: 125.0000 ug | ORAL_TABLET | Freq: Every day | ORAL | 0 refills | Status: DC

## 2022-05-17 ENCOUNTER — Encounter: Payer: Self-pay | Admitting: Internal Medicine

## 2022-05-17 ENCOUNTER — Ambulatory Visit (INDEPENDENT_AMBULATORY_CARE_PROVIDER_SITE_OTHER): Payer: Self-pay | Admitting: Internal Medicine

## 2022-05-17 VITALS — BP 112/80 | HR 70 | Ht 72.0 in | Wt 274.6 lb

## 2022-05-17 DIAGNOSIS — E039 Hypothyroidism, unspecified: Secondary | ICD-10-CM

## 2022-05-17 DIAGNOSIS — M542 Cervicalgia: Secondary | ICD-10-CM

## 2022-05-17 DIAGNOSIS — E038 Other specified hypothyroidism: Secondary | ICD-10-CM

## 2022-05-17 DIAGNOSIS — E063 Autoimmune thyroiditis: Secondary | ICD-10-CM

## 2022-05-17 LAB — T4, FREE: Free T4: 0.82 ng/dL (ref 0.60–1.60)

## 2022-05-17 LAB — TSH: TSH: 3.38 u[IU]/mL (ref 0.35–5.50)

## 2022-05-17 MED ORDER — LEVOTHYROXINE SODIUM 125 MCG PO TABS: 125.0000 ug | ORAL_TABLET | Freq: Every day | ORAL | 3 refills | Status: DC

## 2022-05-17 NOTE — Patient Instructions (Addendum)
Please continue Levothyroxine 125 mcg daily.  Take the thyroid hormone every day, with water, at least 30 minutes before breakfast, separated by at least 4 hours from: - acid reflux medications - calcium - iron - multivitamins  Please stop at the lab.  You should have an endocrinology follow-up appointment in 6 months.

## 2022-05-17 NOTE — Progress Notes (Signed)
Patient ID: Robert Black, male   DOB: 10-24-1995, 27 y.o.   MRN: [id number]  HPI  Robert Black is a 27 y.o.-year-old male, returning for follow-up for Hashimoto's hypothyroidism.  He previously saw Dr. Loanne Drilling, last visit 1 year and 1 month ago.  Pt. has been dx with hypothyroidism in 2014 (screened at that time due to h/o hypothyroidism in mother) >> on generic levothyroxine since diagnosis.  Pt takes 125 mcg of levothyroxine: - ran out x 1 week 1 mo ago - in am - fasting - at least 30 min from b'fast - no calcium - no iron - no multivitamins - no PPIs - not on Biotin  I reviewed pt's thyroid tests: Lab Results  Component Value Date   TSH 2.61 03/20/2020   TSH 8.09 (H) 03/21/2019   TSH 5.52 (H) 03/20/2018   TSH 3.75 03/20/2017   TSH 9.37 (H) 02/14/2017   TSH 6.33 (H) 02/15/2016   TSH 3.45 02/23/2015   TSH 1.02 12/24/2013   TSH 2.098 06/03/2013   TSH 6.187 (H) 11/28/2012   FREET4 0.77 03/20/2020   FREET4 0.76 03/20/2018   FREET4 0.70 03/20/2017   FREET4 0.76 02/14/2017   FREET4 1.03 06/03/2013   FREET4 1.02 11/28/2012   T3FREE 3.3 06/03/2013   T3FREE 3.5 11/28/2012   Antithyroid antibodies: Component     Latest Ref Rng 11/28/2012  Thyroperoxidase Ab SerPl-aCnc     <35.0 IU/mL 97.1 (H)   Thyroglobulin Ab     <40.0 U/mL 223.0 (H)     Pt denies: - weight gain - fatigue - cold intolerance - constipation - tremors - palpitations  Pt denies: - feeling nodules in neck - hoarseness - dysphagia - choking Bu some pressure in his neck over the weekend.t had   She has + FH of thyroid disorders in: M and MGM. No FH of thyroid cancer.  No h/o radiation tx to head or neck. No recent use of iodine or other supplements.  Pt. also has a history of obesity, back pain with radiculopathy, hepatic steatosis, history of drug use.  ROS: + see HPI  Past Medical History:  Diagnosis Date   Asthma    as a child   Hypothyroidism    Thyroid disease    Past  Surgical History:  Procedure Laterality Date   ORIF SCAPHOID FRACTURE Left 06/22/2015   Procedure: OPEN TREATMENT OF LEFT SCAPHOID FRACTURE;  Surgeon: Milly Jakob, MD;  Location: [facility];  Service: Orthopedics;  Laterality: Left;  PREOP BLOCK   TONSILLECTOMY     Social History   Socioeconomic History   Marital status: Single    Spouse name: Not on file   Number of children: Not on file   Years of education: Not on file   Highest education level: Not on file  Occupational History   Not on file  Tobacco Use   Smoking status: Never   Smokeless tobacco: Never  Vaping Use   Vaping Use: Never used  Substance and Sexual Activity   Alcohol use: Yes   Drug use: No   Sexual activity: Yes  Other Topics Concern   Not on file  Social History Narrative   Graduated from high school   Works Architect   Lives with mom, brother, sister, nephew   Social Determinants of Health   Financial Resource Strain: Not on file  Food Insecurity: Not on file  Transportation Needs: Not on file  Physical Activity: Not on file  Stress: Not on file  Social Connections: Not on file  Intimate Partner Violence: Not on file   Current Outpatient Medications on File Prior to Visit  Medication Sig Dispense Refill   levothyroxine (SYNTHROID) 125 MCG tablet TAKE 1 TABLET(125 MCG) BY MOUTH DAILY 90 tablet 3   levothyroxine (SYNTHROID) 125 MCG tablet Take 1 tablet (125 mcg total) by mouth daily before breakfast. Pt needs appt for further refills 45 tablet 0   No current facility-administered medications on file prior to visit.   No Known Allergies Family History  Problem Relation Age of Onset   Thyroid disease Mother        hypothyroid   Obesity Mother    Thyroid disease Maternal Grandmother    Hypertension Maternal Grandmother    PE: BP 112/80 (BP Location: Left Arm, Patient Position: Sitting, Cuff Size: Normal)   Pulse 70   Ht 6' (1.829 m)   Wt 274 lb 9.6 oz (124.6 kg)   SpO2  97%   BMI 37.24 kg/m  Wt Readings from Last 3 Encounters:  05/17/22 274 lb 9.6 oz (124.6 kg)  03/29/21 281 lb (127.5 kg)  06/27/20 280 lb (127 kg)   Constitutional: overweight, in NAD Eyes:  EOMI, no exophthalmos ENT: no neck masses, slight increase in size of thyroid R>L, no cervical lymphadenopathy Cardiovascular: RRR, No MRG Respiratory: CTA B Musculoskeletal: no deformities Skin:no rashes Neurological: no tremor with outstretched hands  ASSESSMENT: 1. 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  Neck pressure  PLAN:  1. Patient with long-standing hypothyroidism, on levothyroxine therapy. - latest thyroid labs reviewed with pt. >> normal: Lab Results  Component Value Date   TSH 2.61 03/20/2020  - he continues on LT4 125 mcg daily - pt feels good on this dose. - we discussed about taking the thyroid hormone every day, with water, >30 minutes before breakfast, separated by >4 hours from acid reflux medications, calcium, iron, multivitamins. Pt. is taking it correctly, doubling the dose when he misses 1 day, but he was off for 1 week approximately 1 month due to running out of the tablets before our appointment.  He was able to restart his LT4 on 28/11/2021. - we discussed about Hashimoto's thyroiditis as an autoimmune disease and the fact that increase in size of his thyroid due to the inflammation is not uncommon.   - will check thyroid tests today: TSH and fT4, but if TSH is elevated, we may need to repeat the tests - If labs are abnormal, he will need to return for repeat TFTs in 1.5 months - OTW, I will see him back in 6 months  2.  Neck pressure -We discussed that Hashimoto's thyroiditis is an autoimmune disease in which his immune system attacks insulin thyroid.  This causes inflammation with subsequent fluctuating size of the thyroid, with possible occasional neck pressure.  I believe that this is what happened over the weekend. -He does have a slightly enlarged  thyroid on palpation -The neck pressure has improved now -No imaging is needed for now  He needs refills - 90 days.  Component     Latest Ref Rng 05/17/2022  TSH     0.35 - 5.50 uIU/mL 3.38   T4,Free(Direct)     0.60 - 1.60 ng/dL 0.82    Normal TFTs.  Philemon Kingdom, MD PhD [facility] Endocrinology

## 2022-11-30 ENCOUNTER — Encounter: Payer: Self-pay | Admitting: Internal Medicine

## 2022-11-30 ENCOUNTER — Ambulatory Visit (INDEPENDENT_AMBULATORY_CARE_PROVIDER_SITE_OTHER): Payer: Self-pay | Admitting: Internal Medicine

## 2022-11-30 VITALS — BP 120/80 | HR 72 | Ht 72.0 in | Wt 263.8 lb

## 2022-11-30 DIAGNOSIS — E063 Autoimmune thyroiditis: Secondary | ICD-10-CM

## 2022-11-30 DIAGNOSIS — E038 Other specified hypothyroidism: Secondary | ICD-10-CM

## 2022-11-30 DIAGNOSIS — M542 Cervicalgia: Secondary | ICD-10-CM

## 2022-11-30 NOTE — Progress Notes (Signed)
Patient ID: Robert Black, male   DOB: May 05, 1996, 27 y.o.   MRN: [id number]  HPI  Robert Black is a 27 y.o.-year-old male, returning for follow-up for Hashimoto's hypothyroidism.  He previously saw Dr. Everardo All, but last visit with me 6 months ago.  Interim history: He feels well at today's visit, but patient is approximately 10 days ago, he was out of levothyroxine and as he left town and forgot it at home.  He did develop more fatigue and had some upper neck fullness, which resolved since.  She has occasional problems swallowing so he has to take water with drier foods.  Pt. has been dx with hypothyroidism in 2014 (screened at that time due to h/o hypothyroidism in mother) >> on generic levothyroxine since diagnosis.  Pt takes 125 mcg of levothyroxine: - missed 1 week 10 days ago (out of town) - in am - fasting - at least 30 min from b'fast - no calcium - no iron - no multivitamins - no PPIs - not on Biotin  I reviewed pt's thyroid tests: Lab Results  Component Value Date   TSH 3.38 05/17/2022   TSH 2.61 03/20/2020   TSH 8.09 (H) 03/21/2019   TSH 5.52 (H) 03/20/2018   TSH 3.75 03/20/2017   TSH 9.37 (H) 02/14/2017   TSH 6.33 (H) 02/15/2016   TSH 3.45 02/23/2015   TSH 1.02 12/24/2013   TSH 2.098 06/03/2013   FREET4 0.82 05/17/2022   FREET4 0.77 03/20/2020   FREET4 0.76 03/20/2018   FREET4 0.70 03/20/2017   FREET4 0.76 02/14/2017   FREET4 1.03 06/03/2013   FREET4 1.02 11/28/2012   T3FREE 3.3 06/03/2013   T3FREE 3.5 11/28/2012   Antithyroid antibodies: Component     Latest Ref Rng 11/28/2012  Thyroperoxidase Ab SerPl-aCnc     <35.0 IU/mL 97.1 (H)   Thyroglobulin Ab     <40.0 U/mL 223.0 (H)     Pt denies: - feeling nodules in neck - hoarseness - Occasional dysphagia relieved by drinking water - choking  She has + FH of thyroid disorders in: M and MGM. No FH of thyroid cancer.  No h/o radiation tx to head or neck. No recent use of iodine or other  supplements.  Pt. also has a history of obesity, back pain with radiculopathy, hepatic steatosis, history of drug use.  ROS: + see HPI  Past Medical History:  Diagnosis Date   Asthma    as a child   Hypothyroidism    Thyroid disease    Past Surgical History:  Procedure Laterality Date   ORIF SCAPHOID FRACTURE Left 06/22/2015   Procedure: OPEN TREATMENT OF LEFT SCAPHOID FRACTURE;  Surgeon: Mack Hook, MD;  Location: [facility];  Service: Orthopedics;  Laterality: Left;  PREOP BLOCK   TONSILLECTOMY     Social History   Socioeconomic History   Marital status: Single    Spouse name: Not on file   Number of children: Not on file   Years of education: Not on file   Highest education level: Not on file  Occupational History   Not on file  Tobacco Use   Smoking status: Never   Smokeless tobacco: Never  Vaping Use   Vaping status: Never Used  Substance and Sexual Activity   Alcohol use: Yes   Drug use: No   Sexual activity: Yes  Other Topics Concern   Not on file  Social History Narrative   Graduated from high school   Works Social research officer, 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 90 tablet 3   No current facility-administered medications on file prior to visit.   No Known Allergies Family History  Problem Relation Age of Onset   Thyroid disease Mother        hypothyroid   Obesity Mother    Thyroid disease Maternal Grandmother    Hypertension Maternal Grandmother    PE: BP 120/80   Pulse  72   Ht 6' (1.829 m)   Wt 263 lb 12.8 oz (119.7 kg)   SpO2 98%   BMI 35.78 kg/m  Wt Readings from Last 3 Encounters:  11/30/22 263 lb 12.8 oz (119.7 kg)  05/17/22 274 lb 9.6 oz (124.6 kg)  03/29/21 281 lb (127.5 kg)   Constitutional: overweight, in NAD Eyes:  EOMI, no exophthalmos ENT: no neck masses, slight increase in size of thyroid R>L, no cervical lymphadenopathy Cardiovascular: RRR, No MRG Respiratory: CTA B Musculoskeletal: no deformities Skin:no rashes Neurological: no tremor with outstretched hands  ASSESSMENT: 1. 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  Neck pressure  PLAN:  1. Patient with standing Hashimoto's hypothyroidism, on levothyroxine therapy. - latest thyroid labs reviewed with pt. >> normal: Lab Results  Component Value Date   TSH 3.38 05/17/2022  - he continues on LT4 125 mcg daily - pt feels good on this dose.  He lost 11 pounds since last visit. - we discussed about taking the thyroid hormone every day, with water, >30 minutes before breakfast, separated by >4 hours from acid reflux medications, calcium, iron, multivitamins. Pt. is taking it correctly. - He is aware that he needs to double up on his levothyroxine the next day if he misses 1 dose - he missed 1 week of levothyroxine when being out of town.  We discussed that we can call in the prescription to an out-of-town pharmacy, if needed in the future. - will check thyroid tests in 1 mo after taking LT4 daily: TSH and fT4 - Otherwise, we will see him back in a year but possibly sooner for labs if the above labs are abnormal  2.  Neck pressure -Most likely caused by inflammation in the setting of Hashimoto's thyroiditis. -At last visit, I could palpate a slightly enlarged thyroid, without nodularity -Neck pressure improved at last visit and has resolved since then except for an episode of neck fullness approximately 10 days ago, coinciding with missing LT4 for 7 days.  However, when he  describes the discomfort he points towards upper neck, so it is possible that he had a viral URI/congestion with enlarged lymph nodes.  I do not feel any cervical lymphadenopathy at today's visit, though. -No imaging is needed for now  Orders Placed This Encounter  Procedures   TSH   T4, free   Carlus Pavlov, MD PhD [facility] End[facility]

## 2022-11-30 NOTE — Patient Instructions (Addendum)
Please continue Levothyroxine 125 mcg daily.  Take the thyroid hormone every day, with water, at least 30 minutes before breakfast, separated by at least 4 hours from: - acid reflux medications - calcium - iron - multivitamins  Please come back for labs in 1 month.  You should have an endocrinology follow-up appointment in 1 year.

## 2023-01-06 ENCOUNTER — Other Ambulatory Visit: Payer: Self-pay

## 2023-06-28 ENCOUNTER — Telehealth: Payer: Self-pay | Admitting: Internal Medicine

## 2023-06-28 NOTE — Telephone Encounter (Signed)
Patient called and needs a refill on Levothyroxine sent to [id number] [place], [place] - [address] OF [address] He stated the pharmacy said they had requested refill but I didn't see anything

## 2023-06-29 ENCOUNTER — Other Ambulatory Visit: Payer: Self-pay

## 2023-06-29 DIAGNOSIS — E039 Hypothyroidism, unspecified: Secondary | ICD-10-CM

## 2023-06-29 MED ORDER — LEVOTHYROXINE SODIUM 125 MCG PO TABS: 125.0000 ug | ORAL_TABLET | Freq: Every day | ORAL | 3 refills | Status: AC

## 2023-12-01 ENCOUNTER — Ambulatory Visit: Payer: Self-pay | Admitting: Internal Medicine

## 2023-12-01 NOTE — Progress Notes (Deleted)
 Patient ID: Robert Black, male   DOB: 10/29/1995, 28 y.o.   MRN: [id number]  HPI  Robert Black is a 28 y.o.-year-old male, returning for follow-up for Hashimoto's hypothyroidism.  He previously saw Dr. Kassie, but last visit with me 1 year ago.  Interim history: He feels well at today's visit, without increased fatigue, weight gain, constipation.  Pt. has been dx with hypothyroidism in 2014 (screened at that time due to h/o hypothyroidism in mother) >> on generic levothyroxine  since diagnosis.  Pt takes 125 mcg of levothyroxine : - in am - fasting - at least 30 min from b'fast - no calcium - no iron - no multivitamins - no PPIs - not on Biotin  I reviewed pt's thyroid  tests: Lab Results  Component Value Date   TSH 3.38 05/17/2022   TSH 2.61 03/20/2020   TSH 8.09 (H) 03/21/2019   TSH 5.52 (H) 03/20/2018   TSH 3.75 03/20/2017   TSH 9.37 (H) 02/14/2017   TSH 6.33 (H) 02/15/2016   TSH 3.45 02/23/2015   TSH 1.02 12/24/2013   TSH 2.098 06/03/2013   FREET4 0.82 05/17/2022   FREET4 0.77 03/20/2020   FREET4 0.76 03/20/2018   FREET4 0.70 03/20/2017   FREET4 0.76 02/14/2017   FREET4 1.03 06/03/2013   FREET4 1.02 11/28/2012   T3FREE 3.3 06/03/2013   T3FREE 3.5 11/28/2012   Antithyroid antibodies were elevated: Component     Latest Ref Rng 11/28/2012  Thyroperoxidase Ab SerPl-aCnc     <35.0 IU/mL 97.1 (H)   Thyroglobulin Ab     <40.0 U/mL 223.0 (H)     Pt denies: - feeling nodules in neck - hoarseness - Occasional dysphagia relieved by drinking water - choking  She has + FH of thyroid  disorders in: M and MGM. No FH of thyroid  cancer.  No h/o radiation tx to head or neck. No recent use of iodine or other supplements.  Pt. also has a history of obesity, back pain with radiculopathy, hepatic steatosis, history of drug use.  ROS: + see HPI  Past Medical History:  Diagnosis Date   Asthma    as a child   Hypothyroidism    Thyroid  disease    Past Surgical  History:  Procedure Laterality Date   ORIF SCAPHOID FRACTURE Left 06/22/2015   Procedure: OPEN TREATMENT OF LEFT SCAPHOID FRACTURE;  Surgeon: Alm Hummer, MD;  Location: [facility];  Service: Orthopedics;  Laterality: Left;  PREOP BLOCK   TONSILLECTOMY     Social History   Socioeconomic History   Marital status: Single    Spouse name: Not on file   Number of children: Not on file   Years of education: Not on file   Highest education level: Not on file  Occupational History   Not on file  Tobacco Use   Smoking status: Never   Smokeless tobacco: Never  Vaping Use   Vaping status: Never Used  Substance and Sexual Activity   Alcohol use: Yes   Drug use: No   Sexual activity: Yes  Other Topics Concern   Not on file  Social History Narrative   Graduated from high school   Works Holiday representative   Lives with mom, brother, sister, nephew   Social Drivers of Corporate investment banker Strain: Not on file  Food Insecurity: Not on file  Transportation Needs: Not on file  Physical Activity: Not on file  Stress: Not on file  Social Connections: Not on file  Intimate Partner Violence: Not on  file   Current Outpatient Medications on File Prior to Visit  Medication Sig Dispense Refill   levothyroxine  (SYNTHROID ) 125 MCG tablet Take 1 tablet (125 mcg total) by mouth daily before breakfast. 90 tablet 3   No current facility-administered medications on file prior to visit.   No Known Allergies Family History  Problem Relation Age of Onset   Thyroid  disease Mother        hypothyroid   Obesity Mother    Thyroid  disease Maternal Grandmother    Hypertension Maternal Grandmother    PE: There were no vitals taken for this visit. Wt Readings from Last 3 Encounters:  11/30/22 263 lb 12.8 oz (119.7 kg)  05/17/22 274 lb 9.6 oz (124.6 kg)  03/29/21 281 lb (127.5 kg)   Constitutional: overweight, in NAD Eyes:  EOMI, no exophthalmos ENT: no neck masses, slight increase  in size of thyroid  R>L, no cervical lymphadenopathy Cardiovascular: RRR, No MRG Respiratory: CTA B Musculoskeletal: no deformities Skin:no rashes Neurological: no tremor with outstretched hands  ASSESSMENT: 1. 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  Neck pressure  PLAN:  1. Patient with longstanding Hashimoto's hypothyroidism, on levothyroxine  therapy - latest thyroid  labs reviewed with pt. >> normal: Lab Results  Component Value Date   TSH 3.38 05/17/2022  - he continues on LT4 125 mcg daily - pt feels good on this dose.  Before last visit, he lost 11 pounds. - we discussed about taking the thyroid  hormone every day, with water, >30 minutes before breakfast, separated by >4 hours from acid reflux medications, calcium, iron, multivitamins. Pt. is taking it correctly.  He is aware that he needs to double the dose the next day if he misses 1 dose. - will check thyroid  tests today: TSH and fT4 - If labs are abnormal, he will need to return for repeat TFTs in 1.5 months - OTW, I will see him back in a year  2.  Neck pressure -He has a slightly enlarged thyroid , without nodularity, most likely in the setting of inflammation due to Hashimoto's thyroiditis - At last visit, his neck pressure improved, but he felt it again after missing levothyroxine  for 7 days.  However, he pointed towards the upper neck at the site of his discomfort, so it was possible that he had a viral URI/congestion, with enlarged lymph nodes.  I did not feel cervical lymphadenopathy on exam at last visit and again today. -no imaging needed for now  No orders of the defined types were placed in this encounter.  Lela Fendt, MD PhD [facility]

## 2024-01-31 ENCOUNTER — Emergency Department: Payer: Self-pay

## 2024-01-31 ENCOUNTER — Emergency Department
Admission: EM | Admit: 2024-01-31 | Discharge: 2024-01-31 | Disposition: A | Discharge status: Home / Self Care | Payer: Self-pay | Source: Ambulatory Visit | Attending: Emergency Medicine | Admitting: Emergency Medicine

## 2024-01-31 ENCOUNTER — Other Ambulatory Visit: Payer: Self-pay

## 2024-01-31 DIAGNOSIS — E039 Hypothyroidism, unspecified: Secondary | ICD-10-CM | POA: Insufficient documentation

## 2024-01-31 DIAGNOSIS — M79661 Pain in right lower leg: Secondary | ICD-10-CM | POA: Insufficient documentation

## 2024-01-31 DIAGNOSIS — J45909 Unspecified asthma, uncomplicated: Secondary | ICD-10-CM | POA: Insufficient documentation

## 2024-01-31 NOTE — Discharge Instructions (Signed)
 Your evaluated in the ED for right calf pain.  Your ultrasound is normal.  There is no evidence of a DVT.  Follow-up with your scheduled orthopedic appointment for further management.  Pain control:  Ibuprofen  (motrin /aleve /advil ) - You can take 3 tablets (600 mg) every 6 hours as needed for pain/fever.  Acetaminophen  (tylenol ) - You can take 2 extra strength tablets (1000 mg) every 6 hours as needed for pain/fever.  You can alternate these medications or take them together.  Make sure you eat food/drink water when taking these medications.

## 2024-01-31 NOTE — ED Triage Notes (Signed)
 Pt sent by orthopedic for R calf pain. Pt has been wearing orthotic boot x2 weeks for broken foot. No swelling to calf. Pt denies SOB/CP.

## 2024-01-31 NOTE — ED Provider Notes (Signed)
 [facility] Emergency Department Provider Note     Event Date/Time   First MD Initiated Contact with Patient 01/31/24 1514     (approximate)   History   Foot Pain   HPI  Robert Black is Black 28 y.o. male with Black past medical history of hypothyroidism and asthma presents to the ED for right calf pain.  Patient reports he broke his foot 2 weeks ago and has been placed in an orthotic boot.  He was advised by his orthopedic doctor to seek ED evaluation to rule out Black DVT.  Denies chest pain and shortness of breath.  Chart reviewed -office visit 09/06 for right ankle pain.  Diagnosed with right fifth metatarsal base fracture on 09/10.  Instructions to wear continuous boot with nonweightbearing status.    Physical Exam   Triage Vital Signs: ED Triage Vitals [01/31/24 1457]  Encounter Vitals Group     BP (!) 143/90     Girls Systolic BP Percentile      Girls Diastolic BP Percentile      Boys Systolic BP Percentile      Boys Diastolic BP Percentile      Pulse Rate 98     Resp 17     Temp 98.2 F (36.8 C)     Temp Source Oral     SpO2 98 %     Weight 270 lb (122.5 kg)     Height 6' (1.829 m)     Head Circumference      Peak Flow      Pain Score 3     Pain Loc      Pain Education      Exclude from Growth Chart     Most recent vital signs: Vitals:   01/31/24 1457  BP: (!) 143/90  Pulse: 98  Resp: 17  Temp: 98.2 F (36.8 C)  SpO2: 98%    General Awake, no distress.  HEENT NCAT.  CV:  Good peripheral perfusion.  RESP:  Normal effort.  ABD:  No distention.  Other:  Patient is in Black cam boot on his right foot.  Mild tenderness to palpation over right calf.  No noted swelling or erythema.  Neurovascular status intact all throughout.   ED Results / Procedures / Treatments   Labs (all labs ordered are listed, but only abnormal results are displayed) Labs Reviewed - No data to display  RADIOLOGY  I personally viewed and evaluated these  images as part of my medical decision making, as well as reviewing the written report by the radiologist.  ED Provider Interpretation: Negative for DVT of right lower extremity  US  Venous Img Lower Unilateral Right Result Date: 01/31/2024 CLINICAL DATA:  Calf pain.  Broken foot. EXAM: RIGHT LOWER EXTREMITY VENOUS DOPPLER ULTRASOUND TECHNIQUE: Gray-scale sonography with graded compression, as well as color Doppler and duplex ultrasound were performed to evaluate the lower extremity deep venous systems from the level of the common femoral vein and including the common femoral, femoral, profunda femoral, popliteal and calf veins including the posterior tibial, peroneal and gastrocnemius veins when visible. Spectral Doppler was utilized to evaluate flow at rest and with distal augmentation maneuvers in the common femoral, femoral and popliteal veins. COMPARISON:  None Available. FINDINGS: Contralateral Common Femoral Vein: Respiratory phasicity is normal and symmetric with the symptomatic side. No evidence of thrombus. Normal compressibility. Common Femoral Vein: No evidence of thrombus. Normal compressibility, respiratory phasicity and response to augmentation. Saphenofemoral Junction: No evidence  of thrombus. Normal compressibility and flow on color Doppler imaging. Profunda Femoral Vein: No evidence of thrombus. Normal compressibility and flow on color Doppler imaging. Femoral Vein: No evidence of thrombus. Normal compressibility, respiratory phasicity and response to augmentation. Popliteal Vein: No evidence of thrombus. Normal compressibility, respiratory phasicity and response to augmentation. Calf Veins: No evidence of thrombus. Normal compressibility and flow on color Doppler imaging. Other Findings:  None. IMPRESSION: Negative for deep venous thrombosis in right lower extremity. Electronically Signed   By: Juliene Balder M.D.   On: 01/31/2024 16:26    PROCEDURES:  Critical Care performed:  No  Procedures   MEDICATIONS ORDERED IN ED: Medications - No data to display   IMPRESSION / MDM / ASSESSMENT AND PLAN / ED COURSE  I reviewed the triage vital signs and the nursing notes.                             Clinical Course as of 01/31/24 1647  Wed Jan 31, 2024  1638 US  Venous Img Lower Unilateral Right IMPRESSION: Negative for deep venous thrombosis in right lower extremity.   [MH]    Clinical Course User Index [MH] Robert Monte A, PA-C   28 y.o. male presents to the emergency department for evaluation and treatment of right calf pain. See HPI for further details.   Differential diagnosis includes, but is not limited to DVT, superficial thrombophlebitis, strain, contusion  Patient's presentation is most consistent with acute complicated illness / injury requiring diagnostic workup.  Patient is alert and oriented.  He is hemodynamic stable.  Physical exam findings are stated above and benign.  No swelling or erythema to right calf.  Ultrasound is reassuring.  Negative for DVT.  Advised patient to loosen cam boot around calf as this is where the specific area of pain is.  Advised to discuss with orthopedic of postop shoe.  Patient does have Black follow-up appointment with his orthopedic.  Advised to continue nonweightbearing status and NSAID use for pain.  RICE education therapy discussed.  Patient verbalized understanding.  He is in stable condition for discharge home.  ED return precaution discussed.  FINAL CLINICAL IMPRESSION(S) / ED DIAGNOSES   Final diagnoses:  Right calf pain   Rx / DC Orders   ED Discharge Orders     None        Note:  This document was prepared using Dragon voice recognition software and may include unintentional dictation errors.    Robert, [name], PA-C 01/31/24 1647    Robert Drivers, MD 01/31/24 [phone]
